# Patient Record
Sex: Female | Born: 1945 | Race: Black or African American | Hispanic: No | Smoking: Never smoker
Health system: Southern US, Community
[De-identification: ages and names within clinical notes are randomized; demographics above are authoritative.]

## PROBLEM LIST (undated history)

## (undated) DIAGNOSIS — E785 Hyperlipidemia, unspecified: Secondary | ICD-10-CM

## (undated) DIAGNOSIS — I10 Essential (primary) hypertension: Secondary | ICD-10-CM

## (undated) DIAGNOSIS — E119 Type 2 diabetes mellitus without complications: Secondary | ICD-10-CM

## (undated) DIAGNOSIS — E049 Nontoxic goiter, unspecified: Secondary | ICD-10-CM

## (undated) HISTORY — PX: PARTIAL HYSTERECTOMY: SHX80

## (undated) HISTORY — PX: ABDOMINAL HYSTERECTOMY: SHX81

## (undated) HISTORY — DX: Hyperlipidemia, unspecified: E78.5

## (undated) HISTORY — DX: Type 2 diabetes mellitus without complications: E11.9

## (undated) HISTORY — DX: Nontoxic goiter, unspecified: E04.9

---

## 1999-02-09 ENCOUNTER — Encounter: Admission: RE | Admit: 1999-02-09 | Discharge: 1999-02-09 | Payer: Self-pay | Admitting: Internal Medicine

## 1999-02-09 ENCOUNTER — Encounter: Payer: Self-pay | Admitting: Internal Medicine

## 2000-02-16 ENCOUNTER — Other Ambulatory Visit: Admission: RE | Admit: 2000-02-16 | Discharge: 2000-02-16 | Payer: Self-pay | Admitting: Obstetrics and Gynecology

## 2000-03-15 ENCOUNTER — Encounter: Admission: RE | Admit: 2000-03-15 | Discharge: 2000-03-15 | Payer: Self-pay | Admitting: Internal Medicine

## 2000-03-15 ENCOUNTER — Encounter: Payer: Self-pay | Admitting: Internal Medicine

## 2001-03-29 ENCOUNTER — Encounter: Payer: Self-pay | Admitting: Internal Medicine

## 2001-03-29 ENCOUNTER — Encounter: Admission: RE | Admit: 2001-03-29 | Discharge: 2001-03-29 | Payer: Self-pay | Admitting: Internal Medicine

## 2001-06-21 ENCOUNTER — Other Ambulatory Visit: Admission: RE | Admit: 2001-06-21 | Discharge: 2001-06-21 | Payer: Self-pay | Admitting: Obstetrics and Gynecology

## 2002-04-03 ENCOUNTER — Encounter: Admission: RE | Admit: 2002-04-03 | Discharge: 2002-04-03 | Payer: Self-pay | Admitting: Internal Medicine

## 2002-04-03 ENCOUNTER — Encounter: Payer: Self-pay | Admitting: Internal Medicine

## 2002-08-19 ENCOUNTER — Other Ambulatory Visit: Admission: RE | Admit: 2002-08-19 | Discharge: 2002-08-19 | Payer: Self-pay | Admitting: Obstetrics and Gynecology

## 2002-12-13 ENCOUNTER — Ambulatory Visit (HOSPITAL_COMMUNITY): Admission: RE | Admit: 2002-12-13 | Discharge: 2002-12-13 | Payer: Self-pay | Admitting: Internal Medicine

## 2003-05-06 ENCOUNTER — Encounter: Admission: RE | Admit: 2003-05-06 | Discharge: 2003-05-06 | Payer: Self-pay | Admitting: Internal Medicine

## 2003-09-10 ENCOUNTER — Other Ambulatory Visit: Admission: RE | Admit: 2003-09-10 | Discharge: 2003-09-10 | Payer: Self-pay | Admitting: Obstetrics and Gynecology

## 2004-05-26 ENCOUNTER — Ambulatory Visit (HOSPITAL_COMMUNITY): Admission: RE | Admit: 2004-05-26 | Discharge: 2004-05-26 | Payer: Self-pay | Admitting: Internal Medicine

## 2004-06-10 ENCOUNTER — Encounter: Admission: RE | Admit: 2004-06-10 | Discharge: 2004-06-10 | Payer: Self-pay | Admitting: Internal Medicine

## 2004-08-11 ENCOUNTER — Emergency Department (HOSPITAL_COMMUNITY): Admission: EM | Admit: 2004-08-11 | Discharge: 2004-08-11 | Payer: Self-pay | Admitting: Emergency Medicine

## 2004-12-09 ENCOUNTER — Other Ambulatory Visit: Admission: RE | Admit: 2004-12-09 | Discharge: 2004-12-09 | Payer: Self-pay | Admitting: Obstetrics and Gynecology

## 2005-05-31 ENCOUNTER — Ambulatory Visit (HOSPITAL_COMMUNITY): Admission: RE | Admit: 2005-05-31 | Discharge: 2005-05-31 | Payer: Self-pay | Admitting: Internal Medicine

## 2006-06-05 ENCOUNTER — Ambulatory Visit (HOSPITAL_COMMUNITY): Admission: RE | Admit: 2006-06-05 | Discharge: 2006-06-05 | Payer: Self-pay | Admitting: Internal Medicine

## 2007-06-07 ENCOUNTER — Ambulatory Visit (HOSPITAL_COMMUNITY): Admission: RE | Admit: 2007-06-07 | Discharge: 2007-06-07 | Payer: Self-pay | Admitting: Internal Medicine

## 2008-01-23 ENCOUNTER — Ambulatory Visit: Payer: Self-pay | Admitting: Internal Medicine

## 2008-02-06 ENCOUNTER — Ambulatory Visit: Payer: Self-pay | Admitting: Internal Medicine

## 2008-02-06 HISTORY — PX: COLONOSCOPY: SHX174

## 2008-02-12 ENCOUNTER — Ambulatory Visit (HOSPITAL_COMMUNITY): Admission: RE | Admit: 2008-02-12 | Discharge: 2008-02-12 | Payer: Self-pay | Admitting: Obstetrics and Gynecology

## 2008-02-15 ENCOUNTER — Ambulatory Visit: Admission: RE | Admit: 2008-02-15 | Discharge: 2008-02-15 | Payer: Self-pay | Admitting: Gynecology

## 2008-03-04 ENCOUNTER — Inpatient Hospital Stay (HOSPITAL_COMMUNITY): Admission: RE | Admit: 2008-03-04 | Discharge: 2008-03-06 | Payer: Self-pay | Admitting: Obstetrics & Gynecology

## 2008-03-04 ENCOUNTER — Encounter: Payer: Self-pay | Admitting: Gynecology

## 2008-04-08 ENCOUNTER — Ambulatory Visit: Admission: RE | Admit: 2008-04-08 | Discharge: 2008-04-08 | Payer: Self-pay | Admitting: Gynecologic Oncology

## 2008-06-11 ENCOUNTER — Ambulatory Visit (HOSPITAL_COMMUNITY): Admission: RE | Admit: 2008-06-11 | Discharge: 2008-06-11 | Payer: Self-pay | Admitting: Obstetrics and Gynecology

## 2009-06-17 ENCOUNTER — Ambulatory Visit (HOSPITAL_COMMUNITY): Admission: RE | Admit: 2009-06-17 | Discharge: 2009-06-17 | Payer: Self-pay | Admitting: Obstetrics and Gynecology

## 2010-05-09 ENCOUNTER — Encounter: Payer: Self-pay | Admitting: Obstetrics and Gynecology

## 2010-07-05 ENCOUNTER — Other Ambulatory Visit (HOSPITAL_COMMUNITY): Payer: Self-pay | Admitting: Obstetrics and Gynecology

## 2010-07-05 DIAGNOSIS — Z1231 Encounter for screening mammogram for malignant neoplasm of breast: Secondary | ICD-10-CM

## 2010-07-14 ENCOUNTER — Ambulatory Visit (HOSPITAL_COMMUNITY)
Admission: RE | Admit: 2010-07-14 | Discharge: 2010-07-14 | Disposition: A | Discharge status: Home / Self Care | Payer: BLUE CROSS/BLUE SHIELD | Source: Ambulatory Visit | Attending: Obstetrics and Gynecology | Admitting: Obstetrics and Gynecology

## 2010-07-14 DIAGNOSIS — Z1231 Encounter for screening mammogram for malignant neoplasm of breast: Secondary | ICD-10-CM | POA: Insufficient documentation

## 2010-08-31 NOTE — Op Note (Signed)
NAMEARELY, TINNER NO.:  [id number]   MEDICAL RECORD NO.:  [id number]          PATIENT TYPE:  INP   LOCATION:  1529                         FACILITY:  [facility]   PHYSICIAN:  De Blanch, M.D.DATE OF BIRTH:  1946-01-20   DATE OF PROCEDURE:  03/04/2008  DATE OF DISCHARGE:                               OPERATIVE REPORT   PREOPERATIVE DIAGNOSIS:  Complex pelvic mass.   POSTOPERATIVE DIAGNOSES:  Left ovarian dermoid, retroperitoneal  fibrosis.   PROCEDURE:  Bilateral salpingo-oophorectomy, bilateral ureterolysis.   SURGEON:  De Blanch, M.D.   FIRST ASSISTANT:  1. Antionette Char MD.  2. Telford Nab, R.N.   ANESTHESIA:  General orotracheal tube.   ESTIMATED BLOOD LOSS:  50 mL.   SURGICAL FINDINGS:  At the time of exploratory laparotomy, the left  ovary was smooth walled, measured approximately 5-6 cm in diameter.  It  was densely adherent to the left pelvic sidewall resulting in  retroperitoneal fibrosis, thus necessitating ureterolysis.  The right  ovary was normal in size, but again was densely adherent to the right  pelvic sidewall and sigmoid colon.  Exploration of the upper abdomen was  normal.   DESCRIPTION OF PROCEDURE:  The patient was brought to the operating room  and after satisfactory attainment of general anesthesia was placed in a  modified lithotomy position in [place] stirrups.  The anterior abdominal  wall, perineum and vagina were prepped with Betadine.  A Foley catheter  was inserted and the patient was draped.  The abdomen was entered  through a former Pfannenstiel incision.  Peritoneal washings were  obtained.  A Bookwalter retractor was assembled.  The upper abdomen was  explored with the above-noted findings.  The bowel was packed out of the  pelvis.  The left round ligament was grasped with a Kelly clamp and  divided and the retroperitoneal space opened identifying the vessels and  ureter.  The ovarian  vessels were skeletonized, clamped, cut, free tied  and suture ligated.  The ovary was densely adherent to the pelvic  sidewall and in order to recent remove it intact, the pelvic sidewall  peritoneum had to be incised circumferentially around the adherent  ovary.  In order to accomplish this safely, ureterolysis was performed  mobilizing the ureter away from the peritoneum and retracting it  laterally.  Using electrocautery for hemostasis, the tube and ovary were  then freed from their attachments to the pelvic sidewall.  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   Attention was turned to the right side of the pelvis.  The right round  ligament was divided and the retroperitoneal space opened.  The ovary  was densely adherent to the sigmoid mesentery.  This was incised and  using careful dissection underneath the ovary and dissecting into  mesentery of the sigmoid colon, the ovary was mobilized from deep in the  pelvis.  Laterally, we identified the ovarian vessels and ureter.  The  ovarian vessels were skeletonized, clamped, cut, free tied and suture  ligated.  Ureterolysis was then performed.  We mobilized  the ureter  laterally away from the retroperitoneal fibrosis.  The ovary was  ultimately resected with cautery for hemostasis.   The pelvis was inspected and hemostasis achieved with cautery.   The packs and retractors were removed.  The anterior abdominal wall was  closed in layers, the first being a running suture of 2-0 Vicryl on the  peritoneum.  The subfascial area was inspected and found to be  hemostatic.  The fascia was closed with a running suture of #1 PDS.  The  subcutaneous tissue was irrigated.  Hemostasis achieved with cautery.  The skin incision closed with staples.  A dressing was applied.  The  patient was awakened from anesthesia and taken to the recovery room in  satisfactory condition.  Sponge, needle and sponge  counts correct x2.      De Blanch, M.D.  Electronically Signed     DC/MEDQ  D:  03/04/2008  T:  03/05/2008  Job:  [id number]   cc:   Guy Sandifer. Henderson Cloud, M.D.  Fax: [phone]   Roseanna Rainbow, M.D.  Fax: [phone]   Telford Nab, R.N.  [address]  [place], [place] [address]

## 2010-08-31 NOTE — Consult Note (Signed)
NAME:  Donna Valenzuela, Donna Valenzuela NO.:  [id number]   MEDICAL RECORD NO.:  [id number]          PATIENT TYPE:  OUT   LOCATION:  GYN                          FACILITY:  [facility]   PHYSICIAN:  Paola A. Duard Brady, MD    DATE OF BIRTH:  04-May-1945   DATE OF CONSULTATION:  04/08/2008  DATE OF DISCHARGE:                                 CONSULTATION   The patient is a 65 year old who is referred to [place] for a newly diagnosed  right adnexal mass.  Her CA-125 was normal and the mass measured 6.4 cm  and it was cystic and solid.  On March 04, 2008 she underwent  exploratory laparotomy, lysis of adhesions, bilateral ureteral lysis and  BSO.  Operative findings included 5-6 cm left ovarian mass that was  densely adherent to left pelvic sidewall resulting in retroperitoneal  fibrosis necessitating ureteral lysis.  The right ovary was normal in  size but was densely adherent to the right pelvic sidewall and sigmoid  colon.  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  There was no malignancy identified.  The  right ovary was unremarkable.  Her washings were negative.  The patient  has done well postoperatively.  She does complain of a cough that she  does not have in the morning, but during the day she develops it.  It is  a dry cough, it is nonproductive.  She does complain of an odor to her  incision.  There is no discharge.  She states she always had a bit of a  pannus and  she would usually put a towel or gauze in that area and she  has not noticed the odor  before.  She is noticing it now since surgery.  There is no discharge on the gauze.  She does have some hot flashes as  well as night sweats.  About 2 years ago she was on Premarin.  She read  a lot of information regarding hormone replacement therapy and the  risks, and she soon came off it.  She states while she is having hot  flashes and night sweats, at this point she does not wish to  be a on any  hormones.   PHYSICAL EXAMINATION:  Weight 165 kg which is down 4 kg from her preop  consultation.  ABDOMEN:  Shows a well-healed transverse skin incision.  Abdomen is  soft, nontender, nondistended.  There are no palpable masses or  hepatosplenomegaly.  There is no discharge.  The wound is well-healed.  LUNGS:  Clear to auscultation bilaterally anterior and posterior lung  fields.   ASSESSMENT:  [age]year-old status post laparotomy, BSO for benign  disease.   PLAN:  1. Her lungs sound clear.  If she continues to have a persistent      cough, I have encouraged her to see her primary      care physician.  She did have a preoperative chest x-ray that was      completely negative.  2. She will follow up with Dr. Huntley Dec on a p.r.n. basis  for her      routine gynecology care.  She knows that we will be happy to see      her in the future should the need arise.      Paola A. Duard Brady, MD  Electronically Signed     PAG/MEDQ  D:  04/08/2008  T:  04/08/2008  Job:  [id number]   cc:   Telford Nab, R.N.  [address]  [place], [place] [address]   Guy Sandifer. Henderson Cloud, M.D.  Fax: [phone]

## 2010-08-31 NOTE — Consult Note (Signed)
NAME:  Donna Valenzuela, Donna Valenzuela NO.:  [id number]   MEDICAL RECORD NO.:  [id number]          PATIENT TYPE:  OUT   LOCATION:  GYN                          FACILITY:  [facility]   PHYSICIAN:  De Blanch, M.D.DATE OF BIRTH:  [birth year]   DATE OF CONSULTATION:  DATE OF DISCHARGE:                                 CONSULTATION   CHIEF COMPLAINT:  Pelvic mass.   HISTORY OF PRESENT ILLNESS:  A 65 year old African-American female seen  in consultation at the request of Dr. Henderson Cloud regarding management of a  newly-diagnosed adnexal mass.  Apparently the patient had the mass  detected at the time of routine annual pelvic examination.  This had  been further evaluated with a CT scan and ultrasound.  CT scan shows the  mass is 6.4 x 3.3 x 4.7 cm and on ultrasound it is solid and cystic.  CA-  125 value is 8.6 units per mL.  The patient is essentially asymptomatic.   The patient does have a past history of undergoing a TAH and possibly  right salpingo-oophorectomy in her 30's for symptomatic uterine  fibroids.   PAST MEDICAL HISTORY:  Medical illnesses:  1. Hypertension.  2. Hypothyroidism.   ALLERGIES:  SULFA (causes respiratory symptoms).   CURRENT MEDICATIONS:  Anti-hypertensive and Synthroid, and diuretic.  Patient did not bring her medicines with her today, so we do not know  the specific names.   FAMILY HISTORY:  Negative for gynecologic, breast, or colon cancer.   SOCIAL HISTORY:  The patient is divorced.  She does not smoke.  She does  not work outside the home.   REVIEW OF SYSTEMS:  Ten-point comprehensive review of systems is  negative, except as noted above.   PHYSICAL EXAMINATION:  VITAL SIGNS:  Height 5 feet 3.  Weight 169  pounds.  Blood pressure 159/106.  Pulse.  GENERAL:  Patient is a healthy black female in no acute distress.  HEENT:  Negative.  NECK:  Supple without thyromegaly.  There is no supraclavicular or  cervical adenopathy.  ABDOMEN:  Soft,  nontender, no masses, organomegaly, or hernias noted.  PELVIC EXAMINATION:  EG, BUS, vagina, and urethra are normal.  Cervix  and uterus surgically absent.  On bimanual examination, on the posterior  vaginal cuff and to the left is a mass measuring approximately 5 to 6 cm  in diameter.  It is not tender, and there is no nodularity.   IMPRESSION:  Complex pelvic mass with a normal CA-125 in menopausal  patient.  I had a lengthy discussion with the patient regarding the  differential diagnosis.  I believe this is likely benign, but I would  recommend it be resected and intraoperative frozen section obtained to  guide any further surgical management.  The risks of surgery, including  hemorrhage, infection, injury to adjacent viscera, thromboembolic  complications, and anesthetic risks were outlined.   Regarding the surgical incision, the patient does have a prior  Pfannenstiel incision.  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  However, the  patient is informed that if she  does have ovarian cancer, we would  likely end up creating a second midline incision to provide for adequate  debulking and staging.   The patient wishes to go ahead with surgery, and we will coordinate this  with Dr. Henderson Cloud.      De Blanch, M.D.  Electronically Signed     DC/MEDQ  D:  02/15/2008  T:  02/15/2008  Job:  [id number]   cc:   Guy Sandifer. Henderson Cloud, M.D.  Fax: [phone]   Telford Nab, R.N.  [address]  [place], [place] [address]

## 2011-01-18 LAB — CBC
HCT: 39.8
HCT: 42.5
MCHC: 33.8
MCV: 89
Platelets: 204
Platelets: 207

## 2011-01-18 LAB — BASIC METABOLIC PANEL
BUN: 10
CO2: 27
Chloride: 104
Creatinine, Ser: 0.98
Glucose, Bld: 128 — ABNORMAL HIGH

## 2011-01-18 LAB — DIFFERENTIAL
Basophils Relative: 1
Lymphocytes Relative: 28
Monocytes Absolute: 0.3
Neutro Abs: 2.7

## 2011-01-18 LAB — COMPREHENSIVE METABOLIC PANEL
ALT: 25
AST: 25
Alkaline Phosphatase: 93
Calcium: 9
Chloride: 105
Creatinine, Ser: 0.82
GFR calc Af Amer: >60
Sodium: 143
Total Bilirubin: 1.1
Total Protein: 7.2

## 2011-01-18 LAB — TYPE AND SCREEN: ABO/RH(D): O POS

## 2011-01-18 LAB — ABO/RH: ABO/RH(D): O POS

## 2011-01-18 LAB — POCT I-STAT 4, (NA,K, GLUC, HGB,HCT): Sodium: 141

## 2011-06-09 ENCOUNTER — Other Ambulatory Visit (HOSPITAL_COMMUNITY): Payer: Self-pay | Admitting: Internal Medicine

## 2011-06-09 DIAGNOSIS — Z1231 Encounter for screening mammogram for malignant neoplasm of breast: Secondary | ICD-10-CM

## 2011-07-15 ENCOUNTER — Ambulatory Visit (HOSPITAL_COMMUNITY)
Admission: RE | Admit: 2011-07-15 | Discharge: 2011-07-15 | Disposition: A | Discharge status: Home / Self Care | Payer: Medicare Other | Source: Ambulatory Visit | Attending: Internal Medicine | Admitting: Internal Medicine

## 2011-07-15 DIAGNOSIS — Z1231 Encounter for screening mammogram for malignant neoplasm of breast: Secondary | ICD-10-CM | POA: Insufficient documentation

## 2012-06-12 ENCOUNTER — Other Ambulatory Visit (HOSPITAL_COMMUNITY): Payer: Self-pay | Admitting: Internal Medicine

## 2012-06-12 DIAGNOSIS — Z1231 Encounter for screening mammogram for malignant neoplasm of breast: Secondary | ICD-10-CM

## 2012-07-15 ENCOUNTER — Encounter (HOSPITAL_COMMUNITY): Payer: Self-pay | Admitting: Emergency Medicine

## 2012-07-15 ENCOUNTER — Emergency Department (HOSPITAL_COMMUNITY)
Admission: EM | Admit: 2012-07-15 | Discharge: 2012-07-16 | Disposition: A | Discharge status: Home / Self Care | Payer: 59 | Attending: Emergency Medicine | Admitting: Emergency Medicine

## 2012-07-15 DIAGNOSIS — Z79899 Other long term (current) drug therapy: Secondary | ICD-10-CM | POA: Insufficient documentation

## 2012-07-15 DIAGNOSIS — R112 Nausea with vomiting, unspecified: Secondary | ICD-10-CM | POA: Insufficient documentation

## 2012-07-15 DIAGNOSIS — E876 Hypokalemia: Secondary | ICD-10-CM | POA: Insufficient documentation

## 2012-07-15 DIAGNOSIS — R197 Diarrhea, unspecified: Secondary | ICD-10-CM | POA: Insufficient documentation

## 2012-07-15 DIAGNOSIS — I10 Essential (primary) hypertension: Secondary | ICD-10-CM | POA: Insufficient documentation

## 2012-07-15 HISTORY — DX: Essential (primary) hypertension: I10

## 2012-07-15 LAB — COMPREHENSIVE METABOLIC PANEL
Alkaline Phosphatase: 101 U/L (ref 39–117)
BUN: 19 mg/dL (ref 6–23)
GFR calc Af Amer: 77 mL/min — ABNORMAL LOW (ref >90)
Glucose, Bld: 122 mg/dL — ABNORMAL HIGH (ref 70–99)
Potassium: 2.7 mEq/L — CL (ref 3.5–5.1)
Total Bilirubin: 0.7 mg/dL (ref 0.3–1.2)
Total Protein: 7.6 g/dL (ref 6.0–8.3)

## 2012-07-15 LAB — CBC WITH DIFFERENTIAL/PLATELET
Eosinophils Absolute: 0 10*3/uL (ref 0.0–0.7)
HCT: 41.7 % (ref 36.0–46.0)
Hemoglobin: 14 g/dL (ref 12.0–15.0)
Lymphs Abs: 0.8 10*3/uL (ref 0.7–4.0)
MCH: 29.4 pg (ref 26.0–34.0)
MCV: 87.4 fL (ref 78.0–100.0)
Monocytes Relative: 16 % — ABNORMAL HIGH (ref 3–12)
Neutrophils Relative %: 69 % (ref 43–77)
RBC: 4.77 MIL/uL (ref 3.87–5.11)

## 2012-07-15 MED ORDER — ONDANSETRON HCL 4 MG/2ML IJ SOLN
4.0000 mg | Freq: Once | INTRAMUSCULAR | Status: AC
  Administered 2012-07-15: 4 mg via INTRAVENOUS
  Filled 2012-07-15: qty 2

## 2012-07-15 MED ORDER — SODIUM CHLORIDE 0.9 % IV BOLUS (SEPSIS)
1000.0000 mL | Freq: Once | INTRAVENOUS | Status: AC
  Administered 2012-07-15: 1000 mL via INTRAVENOUS

## 2012-07-15 NOTE — ED Notes (Signed)
Per pt, N/V/D since Friday night.

## 2012-07-15 NOTE — ED Notes (Signed)
Pt c/o n/v/d x 2 days. PWD, denies pain

## 2012-07-16 ENCOUNTER — Ambulatory Visit (HOSPITAL_COMMUNITY): Payer: Medicare Other

## 2012-07-16 LAB — URINALYSIS, ROUTINE W REFLEX MICROSCOPIC
Ketones, ur: NEGATIVE mg/dL
Leukocytes, UA: NEGATIVE
Nitrite: NEGATIVE
Protein, ur: 30 mg/dL — AB
Urobilinogen, UA: 1 mg/dL (ref 0.0–1.0)

## 2012-07-16 MED ORDER — POTASSIUM CHLORIDE 10 MEQ/100ML IV SOLN
10.0000 meq | INTRAVENOUS | Status: DC
  Administered 2012-07-16 (×2): 10 meq via INTRAVENOUS
  Filled 2012-07-16: qty 300

## 2012-07-16 MED ORDER — POTASSIUM CHLORIDE ER 10 MEQ PO TBCR: 10.0000 meq | EXTENDED_RELEASE_TABLET | Freq: Two times a day (BID) | ORAL | Status: DC

## 2012-07-16 MED ORDER — ONDANSETRON HCL 4 MG PO TABS: 4.0000 mg | ORAL_TABLET | Freq: Four times a day (QID) | ORAL | Status: DC

## 2012-07-16 NOTE — ED Provider Notes (Signed)
History     CSN: [id number]  Arrival date & time 07/15/12  2208   First MD Initiated Contact with Patient 07/15/12 2302      Chief Complaint  Patient presents with  . Emesis  . Diarrhea    (Consider location/radiation/quality/duration/timing/severity/associated sxs/prior treatment) HPI 67 yo female presents to the ER with complaint of n/v/d x 3 days.  No abdominal pain.  No fevers.  Family members with similar sxs.  No blood in emesis or diarrhea.  No urinary symptoms.    Past Medical History  Diagnosis Date  . Hypertension     Past Surgical History  Procedure Laterality Date  . Abdominal hysterectomy      No family history on file.  History  Substance Use Topics  . Smoking status: Never Smoker   . Smokeless tobacco: Not on file  . Alcohol Use: No    OB History   Grav Para Term Preterm Abortions TAB SAB Ect Mult Living                  Review of Systems  All other systems reviewed and are negative.    Allergies  Sulfonamide derivatives  Home Medications   Current Outpatient Rx  Name  Route  Sig  Dispense  Refill  . amLODipine (NORVASC) 5 MG tablet   Oral   Take 5 mg by mouth daily.         . carvedilol (COREG) 25 MG tablet   Oral   Take 25 mg by mouth 2 (two) times daily with a meal.         . hydrochlorothiazide (HYDRODIURIL) 25 MG tablet   Oral   Take 25 mg by mouth daily.         Marland Kitchen levothyroxine (SYNTHROID, LEVOTHROID) 100 MCG tablet   Oral   Take 100 mcg by mouth daily.         . ondansetron (ZOFRAN) 4 MG tablet   Oral   Take 1 tablet (4 mg total) by mouth every 6 (six) hours.   12 tablet   0   . potassium chloride (K-DUR) 10 MEQ tablet   Oral   Take 1 tablet (10 mEq total) by mouth 2 (two) times daily.   30 tablet   0     BP 124/74  Pulse 101  Temp(Src) 99.8 F (37.7 C) (Oral)  Resp 18  Wt 174 lb (78.926 kg)  SpO2 91%  Physical Exam  Nursing note and vitals reviewed. Constitutional: She is oriented to person,  place, and time. She appears well-developed and well-nourished.  HENT:  Head: Normocephalic and atraumatic.  Right Ear: External ear normal.  Left Ear: External ear normal.  Nose: Nose normal.  Dry mucous membranes  Eyes: Conjunctivae and EOM are normal. Pupils are equal, round, and reactive to light.  Neck: Normal range of motion. Neck supple. No JVD present. No tracheal deviation present. No thyromegaly present.  Cardiovascular: Normal rate, regular rhythm, normal heart sounds and intact distal pulses.  Exam reveals no gallop and no friction rub.   No murmur heard. Pulmonary/Chest: Effort normal and breath sounds normal. No stridor. No respiratory distress. She has no wheezes. She has no rales. She exhibits no tenderness.  Abdominal: Soft. She exhibits no distension and no mass. There is no tenderness. There is no rebound and no guarding.  hyperactive  Musculoskeletal: Normal range of motion. She exhibits no edema and no tenderness.  Lymphadenopathy:    She has no cervical adenopathy.  Neurological: She is alert and oriented to person, place, and time. She exhibits normal muscle tone. Coordination normal.  Skin: Skin is warm and dry. No rash noted. No erythema. No pallor.  Psychiatric: She has a normal mood and affect. Her behavior is normal. Judgment and thought content normal.    ED Course  Procedures (including critical care time)  Labs Reviewed  CBC WITH DIFFERENTIAL - Abnormal; Notable for the following:    Monocytes Relative 16 (*)    All other components within normal limits  COMPREHENSIVE METABOLIC PANEL - Abnormal; Notable for the following:    Potassium 2.7 (*)    Glucose, Bld 122 (*)    GFR calc non Af Amer 66 (*)    GFR calc Af Amer 77 (*)    All other components within normal limits  URINALYSIS, ROUTINE W REFLEX MICROSCOPIC - Abnormal; Notable for the following:    Specific Gravity, Urine 1.031 (*)    Hgb urine dipstick TRACE (*)    Protein, ur 30 (*)    All  other components within normal limits  URINE MICROSCOPIC-ADD ON - Abnormal; Notable for the following:    Squamous Epithelial / LPF FEW (*)    Bacteria, UA FEW (*)    Casts HYALINE CASTS (*)    All other components within normal limits   No results found.   1. Nausea vomiting and diarrhea   2. Hypokalemia       MDM  67 year old female with 3 days of nausea, vomiting, diarrhea.  No abdominal pain.  Patient found to have hypokalemia.  Otherwise, exam unremarkable.  She has tolerated by mouth.  She's received some IV potassium here.  We'll discharge her home with short course of oral potassium.  Suspect hypokalemia do to recent losses from diarrhea.        Olivia Mackie, MD 07/18/12 1323

## 2012-07-24 ENCOUNTER — Ambulatory Visit (HOSPITAL_COMMUNITY)
Admission: RE | Admit: 2012-07-24 | Discharge: 2012-07-24 | Disposition: A | Discharge status: Home / Self Care | Payer: Medicare Other | Source: Ambulatory Visit | Attending: Internal Medicine | Admitting: Internal Medicine

## 2012-07-24 DIAGNOSIS — Z1231 Encounter for screening mammogram for malignant neoplasm of breast: Secondary | ICD-10-CM | POA: Insufficient documentation

## 2013-06-25 ENCOUNTER — Other Ambulatory Visit (HOSPITAL_COMMUNITY): Payer: Self-pay | Admitting: Internal Medicine

## 2013-06-25 DIAGNOSIS — Z1231 Encounter for screening mammogram for malignant neoplasm of breast: Secondary | ICD-10-CM

## 2013-07-29 ENCOUNTER — Ambulatory Visit (HOSPITAL_COMMUNITY)
Admission: RE | Admit: 2013-07-29 | Discharge: 2013-07-29 | Disposition: A | Discharge status: Home / Self Care | Payer: Medicare Other | Source: Ambulatory Visit | Attending: Internal Medicine | Admitting: Internal Medicine

## 2013-07-29 DIAGNOSIS — Z1231 Encounter for screening mammogram for malignant neoplasm of breast: Secondary | ICD-10-CM | POA: Insufficient documentation

## 2014-01-16 ENCOUNTER — Encounter: Payer: Self-pay | Admitting: Internal Medicine

## 2014-07-07 ENCOUNTER — Other Ambulatory Visit (HOSPITAL_COMMUNITY): Payer: Self-pay | Admitting: Internal Medicine

## 2014-07-07 DIAGNOSIS — Z1231 Encounter for screening mammogram for malignant neoplasm of breast: Secondary | ICD-10-CM

## 2014-07-31 ENCOUNTER — Ambulatory Visit (HOSPITAL_COMMUNITY)
Admission: RE | Admit: 2014-07-31 | Discharge: 2014-07-31 | Disposition: A | Discharge status: Home / Self Care | Payer: Medicare Other | Source: Ambulatory Visit | Attending: Internal Medicine | Admitting: Internal Medicine

## 2014-07-31 DIAGNOSIS — Z1231 Encounter for screening mammogram for malignant neoplasm of breast: Secondary | ICD-10-CM | POA: Diagnosis not present

## 2015-06-29 ENCOUNTER — Other Ambulatory Visit: Payer: Self-pay

## 2015-06-29 DIAGNOSIS — Z1231 Encounter for screening mammogram for malignant neoplasm of breast: Secondary | ICD-10-CM

## 2015-08-03 ENCOUNTER — Ambulatory Visit
Admission: RE | Admit: 2015-08-03 | Discharge: 2015-08-03 | Disposition: A | Discharge status: Home / Self Care | Payer: Medicare Other | Source: Ambulatory Visit

## 2015-08-03 DIAGNOSIS — Z1231 Encounter for screening mammogram for malignant neoplasm of breast: Secondary | ICD-10-CM

## 2016-06-29 ENCOUNTER — Other Ambulatory Visit: Payer: Self-pay | Admitting: Internal Medicine

## 2016-06-29 DIAGNOSIS — Z1231 Encounter for screening mammogram for malignant neoplasm of breast: Secondary | ICD-10-CM

## 2016-08-03 ENCOUNTER — Ambulatory Visit
Admission: RE | Admit: 2016-08-03 | Discharge: 2016-08-03 | Disposition: A | Discharge status: Home / Self Care | Payer: Medicare Other | Source: Ambulatory Visit | Attending: Internal Medicine | Admitting: Internal Medicine

## 2016-08-03 DIAGNOSIS — Z1231 Encounter for screening mammogram for malignant neoplasm of breast: Secondary | ICD-10-CM

## 2016-12-23 ENCOUNTER — Other Ambulatory Visit (HOSPITAL_COMMUNITY)
Admission: RE | Admit: 2016-12-23 | Discharge: 2016-12-23 | Disposition: A | Discharge status: Home / Self Care | Payer: Medicare Other | Source: Ambulatory Visit | Attending: Internal Medicine | Admitting: Internal Medicine

## 2016-12-23 DIAGNOSIS — M25469 Effusion, unspecified knee: Secondary | ICD-10-CM | POA: Diagnosis present

## 2016-12-23 LAB — SYNOVIAL CELL COUNT + DIFF, W/ CRYSTALS
Crystals, Fluid: NONE SEEN
Eosinophils-Synovial: 0 % (ref 0–1)
LYMPHOCYTES-SYNOVIAL FLD: 20 % (ref 0–20)
MONOCYTE-MACROPHAGE-SYNOVIAL FLUID: 70 % (ref 50–90)
Neutrophil, Synovial: 10 % (ref 0–25)
WBC, Synovial: 175 /mm3 (ref 0–200)

## 2016-12-26 LAB — BODY FLUID CULTURE: Culture: NO GROWTH

## 2017-06-23 ENCOUNTER — Other Ambulatory Visit: Payer: Self-pay | Admitting: Internal Medicine

## 2017-06-23 DIAGNOSIS — Z139 Encounter for screening, unspecified: Secondary | ICD-10-CM

## 2017-08-04 ENCOUNTER — Ambulatory Visit: Payer: Medicare Other

## 2017-08-16 ENCOUNTER — Ambulatory Visit
Admission: RE | Admit: 2017-08-16 | Discharge: 2017-08-16 | Disposition: A | Discharge status: Home / Self Care | Payer: Medicare Other | Source: Ambulatory Visit | Attending: Internal Medicine | Admitting: Internal Medicine

## 2017-08-16 DIAGNOSIS — Z139 Encounter for screening, unspecified: Secondary | ICD-10-CM

## 2018-02-15 ENCOUNTER — Encounter: Payer: Self-pay | Admitting: Internal Medicine

## 2018-02-22 ENCOUNTER — Encounter: Payer: Self-pay | Admitting: Internal Medicine

## 2018-04-02 ENCOUNTER — Ambulatory Visit (AMBULATORY_SURGERY_CENTER): Payer: Self-pay | Admitting: *Deleted

## 2018-04-02 ENCOUNTER — Encounter: Payer: Self-pay | Admitting: Internal Medicine

## 2018-04-02 ENCOUNTER — Encounter: Payer: Self-pay | Admitting: *Deleted

## 2018-04-02 VITALS — Ht 62.0 in | Wt 173.0 lb

## 2018-04-02 DIAGNOSIS — Z1211 Encounter for screening for malignant neoplasm of colon: Secondary | ICD-10-CM

## 2018-04-02 MED ORDER — NA SULFATE-K SULFATE-MG SULF 17.5-3.13-1.6 GM/177ML PO SOLN
ORAL | 0 refills | Status: DC
Start: 2018-04-02 — End: 2018-04-16

## 2018-04-02 NOTE — Progress Notes (Signed)
Patient denies any allergies to eggs or soy. Patient denies any problems with anesthesia/sedation. Patient denies any oxygen use at home. Patient denies taking any diet/weight loss medications or blood thinners. EMMI education offered, pt declined.  

## 2018-04-16 ENCOUNTER — Encounter: Payer: Self-pay | Admitting: Internal Medicine

## 2018-04-16 ENCOUNTER — Ambulatory Visit (AMBULATORY_SURGERY_CENTER): Payer: Medicare Other | Admitting: Internal Medicine

## 2018-04-16 VITALS — BP 130/70 | HR 72 | Temp 97.5°F | Resp 16 | Ht 62.0 in | Wt 173.0 lb

## 2018-04-16 DIAGNOSIS — Z1211 Encounter for screening for malignant neoplasm of colon: Secondary | ICD-10-CM

## 2018-04-16 MED ORDER — SODIUM CHLORIDE 0.9 % IV SOLN: 500.0000 mL | Freq: Once | INTRAVENOUS | Status: DC

## 2018-04-16 NOTE — Progress Notes (Signed)
Report to PACU, RN, vss, BBS= Clear.  

## 2018-04-16 NOTE — Op Note (Signed)
[facility] Patient Name: Donna Valenzuela Procedure Date: 04/16/2018 11:41 AM MRN: [id number] Endoscopist: Wilhemina Bonito. Marina Goodell , MD Age: 72 Referring MD:  Date of Birth: 1945-06-15 Gender: Female Account #: [id number] Procedure:                Colonoscopy Indications:              Screening for colorectal malignant neoplasm.                            Negative exam 2009 Medicines:                Monitored Anesthesia Care Procedure:                Pre-Anesthesia Assessment:                           - Prior to the procedure, a History and Physical                            was performed, and patient medications and                            allergies were reviewed. The patient's tolerance of                            previous anesthesia was also reviewed. The risks                            and benefits of the procedure and the sedation                            options and risks were discussed with the patient.                            All questions were answered, and informed consent                            was obtained. Prior Anticoagulants: The patient has                            taken no previous anticoagulant or antiplatelet                            agents. ASA Grade Assessment: II - A patient with                            mild systemic disease. After reviewing the risks                            and benefits, the patient was deemed in                            satisfactory condition to undergo the procedure.  After obtaining informed consent, the colonoscope                            was passed under direct vision. Throughout the                            procedure, the patient's blood pressure, pulse, and                            oxygen saturations were monitored continuously. The                            Colonoscope was introduced through the anus and                            advanced to the the cecum, identified by                          appendiceal orifice and ileocecal valve. The                            ileocecal valve, appendiceal orifice, and rectum                            were photographed. The quality of the bowel                            preparation was excellent. The colonoscopy was                            performed without difficulty. The patient tolerated                            the procedure well. The bowel preparation used was                            SUPREP. Scope In: 11:52:15 AM Scope Out: 12:03:16 PM Scope Withdrawal Time: 0 hours 7 minutes 10 seconds  Total Procedure Duration: 0 hours 11 minutes 1 second  Findings:                 The entire examined colon appeared normal on direct                            and retroflexion views. Complications:            No immediate complications. Estimated blood loss:                            None. Estimated Blood Loss:     Estimated blood loss: none. Impression:               - The entire examined colon is normal on direct and                            retroflexion views.                           -  No specimens collected. Recommendation:           - Repeat colonoscopy is not recommended for                            screening purposes.                           - Patient has a contact number available for                            emergencies. The signs and symptoms of potential                            delayed complications were discussed with the                            patient. Return to normal activities tomorrow.                            Written discharge instructions were provided to the                            patient.                           - Resume previous diet.                           - Continue present medications. Wilhemina BonitoJohn N. Marina GoodellPerry, MD 04/16/2018 12:06:50 PM This report has been signed electronically.

## 2018-04-16 NOTE — Patient Instructions (Signed)
YOU HAD AN ENDOSCOPIC PROCEDURE TODAY AT THE [facility]:   Refer to the procedure report that was given to you for any specific questions about what was found during the examination.  If the procedure report does not answer your questions, please call your gastroenterologist to clarify.  If you requested that your care partner not be given the details of your procedure findings, then the procedure report has been included in a sealed envelope for you to review at your convenience later.  YOU SHOULD EXPECT: Some feelings of bloating in the abdomen. Passage of more gas than usual.  Walking can help get rid of the air that was put into your GI tract during the procedure and reduce the bloating. If you had a lower endoscopy (such as a colonoscopy or flexible sigmoidoscopy) you may notice spotting of blood in your stool or on the toilet paper. If you underwent a bowel prep for your procedure, you may not have a normal bowel movement for a few days.  Please Note:  You might notice some irritation and congestion in your nose or some drainage.  This is from the oxygen used during your procedure.  There is no need for concern and it should clear up in a day or so.  SYMPTOMS TO REPORT IMMEDIATELY:   Following lower endoscopy (colonoscopy or flexible sigmoidoscopy):  Excessive amounts of blood in the stool  Significant tenderness or worsening of abdominal pains  Swelling of the abdomen that is new, acute  Fever of 100F or higher  For urgent or emergent issues, a gastroenterologist can be reached at any hour by calling [phone].   DIET:  We do recommend a small meal at first, but then you may proceed to your regular diet.  Drink plenty of fluids but you should avoid alcoholic beverages for 24 hours.  ACTIVITY:  You should plan to take it easy for the rest of today and you should NOT DRIVE or use heavy machinery until tomorrow (because of the sedation medicines used during the test).     FOLLOW UP: Our staff will call the number listed on your records the next business day following your procedure to check on you and address any questions or concerns that you may have regarding the information given to you following your procedure. If we do not reach you, we will leave a message.  However, if you are feeling well and you are not experiencing any problems, there is no need to return our call.  We will assume that you have returned to your regular daily activities without incident.  If any biopsies were taken you will be contacted by phone or by letter within the next 1-3 weeks.  Please call us at [phone] if you have not heard about the biopsies in 3 weeks.    SIGNATURES/CONFIDENTIALITY: You and/or your care partner have signed paperwork which will be entered into your electronic medical record.  These signatures attest to the fact that that the information above on your After Visit Summary has been reviewed and is understood.  Full responsibility of the confidentiality of this discharge information lies with you and/or your care-partner. 

## 2018-04-16 NOTE — Progress Notes (Signed)
Pt's states no medical or surgical changes since previsit or office visit. 

## 2018-04-17 ENCOUNTER — Telehealth: Payer: Self-pay

## 2018-04-17 NOTE — Telephone Encounter (Signed)
  Follow up Call-  Call back number 04/16/2018  Post procedure Call Back phone  # [phone]  Permission to leave phone message Yes  Some recent data might be hidden     Patient questions:  Do you have a fever, pain , or abdominal swelling? No Pain Score  0  Have you tolerated food without any problems? Yes  Have you been able to return to your normal activities? Yes  Do you have any questions about your discharge instructions: Diet   No Medications  No Follow up visit  No  Do you have questions or concerns about your Care? No  Actions: * If pain score is 4 or above: No action needed, pain <4

## 2018-07-06 ENCOUNTER — Other Ambulatory Visit: Payer: Self-pay | Admitting: Internal Medicine

## 2018-07-06 DIAGNOSIS — Z1231 Encounter for screening mammogram for malignant neoplasm of breast: Secondary | ICD-10-CM

## 2018-08-21 ENCOUNTER — Ambulatory Visit: Payer: Medicare Other

## 2018-10-02 ENCOUNTER — Other Ambulatory Visit: Payer: Self-pay

## 2018-10-02 ENCOUNTER — Ambulatory Visit
Admission: RE | Admit: 2018-10-02 | Discharge: 2018-10-02 | Disposition: A | Discharge status: Home / Self Care | Payer: Medicare Other | Source: Ambulatory Visit | Attending: Internal Medicine | Admitting: Internal Medicine

## 2018-10-02 DIAGNOSIS — Z1231 Encounter for screening mammogram for malignant neoplasm of breast: Secondary | ICD-10-CM

## 2019-08-02 DIAGNOSIS — I10 Essential (primary) hypertension: Secondary | ICD-10-CM | POA: Diagnosis not present

## 2019-08-02 DIAGNOSIS — E669 Obesity, unspecified: Secondary | ICD-10-CM | POA: Diagnosis not present

## 2019-08-02 DIAGNOSIS — E038 Other specified hypothyroidism: Secondary | ICD-10-CM | POA: Diagnosis not present

## 2019-08-02 DIAGNOSIS — R609 Edema, unspecified: Secondary | ICD-10-CM | POA: Diagnosis not present

## 2019-08-02 DIAGNOSIS — E1169 Type 2 diabetes mellitus with other specified complication: Secondary | ICD-10-CM | POA: Diagnosis not present

## 2019-08-26 ENCOUNTER — Other Ambulatory Visit: Payer: Self-pay | Admitting: Orthopedic Surgery

## 2019-08-26 ENCOUNTER — Other Ambulatory Visit: Payer: Self-pay | Admitting: Internal Medicine

## 2019-08-26 DIAGNOSIS — Z1231 Encounter for screening mammogram for malignant neoplasm of breast: Secondary | ICD-10-CM

## 2019-10-03 ENCOUNTER — Ambulatory Visit
Admission: RE | Admit: 2019-10-03 | Discharge: 2019-10-03 | Disposition: A | Discharge status: Home / Self Care | Payer: Medicare Other | Source: Ambulatory Visit | Attending: Internal Medicine | Admitting: Internal Medicine

## 2019-10-03 ENCOUNTER — Other Ambulatory Visit: Payer: Self-pay

## 2019-10-03 DIAGNOSIS — Z1231 Encounter for screening mammogram for malignant neoplasm of breast: Secondary | ICD-10-CM

## 2020-02-28 DIAGNOSIS — I10 Essential (primary) hypertension: Secondary | ICD-10-CM | POA: Diagnosis not present

## 2020-02-28 DIAGNOSIS — R002 Palpitations: Secondary | ICD-10-CM | POA: Diagnosis not present

## 2020-02-28 DIAGNOSIS — E039 Hypothyroidism, unspecified: Secondary | ICD-10-CM | POA: Diagnosis not present

## 2020-02-28 DIAGNOSIS — E1169 Type 2 diabetes mellitus with other specified complication: Secondary | ICD-10-CM | POA: Diagnosis not present

## 2020-03-06 ENCOUNTER — Ambulatory Visit: Payer: Medicare PPO | Attending: Internal Medicine

## 2020-03-06 DIAGNOSIS — E039 Hypothyroidism, unspecified: Secondary | ICD-10-CM | POA: Diagnosis not present

## 2020-03-06 DIAGNOSIS — E1169 Type 2 diabetes mellitus with other specified complication: Secondary | ICD-10-CM | POA: Diagnosis not present

## 2020-03-06 DIAGNOSIS — R609 Edema, unspecified: Secondary | ICD-10-CM | POA: Diagnosis not present

## 2020-03-06 DIAGNOSIS — I10 Essential (primary) hypertension: Secondary | ICD-10-CM | POA: Diagnosis not present

## 2020-03-06 DIAGNOSIS — R82998 Other abnormal findings in urine: Secondary | ICD-10-CM | POA: Diagnosis not present

## 2020-03-06 DIAGNOSIS — Z0000 Encounter for general adult medical examination without abnormal findings: Secondary | ICD-10-CM | POA: Diagnosis not present

## 2020-03-06 DIAGNOSIS — Z23 Encounter for immunization: Secondary | ICD-10-CM

## 2020-03-06 DIAGNOSIS — E119 Type 2 diabetes mellitus without complications: Secondary | ICD-10-CM | POA: Diagnosis not present

## 2020-03-06 DIAGNOSIS — Z6831 Body mass index (BMI) 31.0-31.9, adult: Secondary | ICD-10-CM | POA: Diagnosis not present

## 2020-03-06 NOTE — Progress Notes (Signed)
   Covid-19 Vaccination Clinic  Name:  Donna Valenzuela    MRN: [id number] DOB: 1945/12/24  03/06/2020  [name] was observed post Covid-19 immunization for 15 minutes without incident. She was provided with Vaccine Information Sheet and instruction to access the V-Safe system.   [name] was instructed to call 911 with any severe reactions post vaccine: Marland Kitchen Difficulty breathing  . Swelling of face and throat  . A fast heartbeat  . A bad rash all over body  . Dizziness and weakness   Immunizations Administered    Name Date Dose VIS Date Route   Pfizer COVID-19 Vaccine 03/06/2020  4:01 PM 0.3 mL 02/05/2020 Intramuscular   Manufacturer: [employer], [employer]   Lot: [id number]   NDC: [address]-5

## 2020-03-26 DIAGNOSIS — Z23 Encounter for immunization: Secondary | ICD-10-CM | POA: Diagnosis not present

## 2020-05-22 DIAGNOSIS — H43811 Vitreous degeneration, right eye: Secondary | ICD-10-CM | POA: Diagnosis not present

## 2020-05-22 DIAGNOSIS — H18513 Endothelial corneal dystrophy, bilateral: Secondary | ICD-10-CM | POA: Diagnosis not present

## 2020-05-22 DIAGNOSIS — E119 Type 2 diabetes mellitus without complications: Secondary | ICD-10-CM | POA: Diagnosis not present

## 2020-05-22 DIAGNOSIS — H2513 Age-related nuclear cataract, bilateral: Secondary | ICD-10-CM | POA: Diagnosis not present

## 2020-08-06 DIAGNOSIS — H2511 Age-related nuclear cataract, right eye: Secondary | ICD-10-CM | POA: Diagnosis not present

## 2020-08-06 DIAGNOSIS — H5703 Miosis: Secondary | ICD-10-CM | POA: Diagnosis not present

## 2020-08-24 ENCOUNTER — Other Ambulatory Visit: Payer: Self-pay | Admitting: Internal Medicine

## 2020-08-24 DIAGNOSIS — Z1231 Encounter for screening mammogram for malignant neoplasm of breast: Secondary | ICD-10-CM

## 2020-09-15 DIAGNOSIS — E039 Hypothyroidism, unspecified: Secondary | ICD-10-CM | POA: Diagnosis not present

## 2020-09-15 DIAGNOSIS — R21 Rash and other nonspecific skin eruption: Secondary | ICD-10-CM | POA: Diagnosis not present

## 2020-09-15 DIAGNOSIS — I10 Essential (primary) hypertension: Secondary | ICD-10-CM | POA: Diagnosis not present

## 2020-09-15 DIAGNOSIS — R609 Edema, unspecified: Secondary | ICD-10-CM | POA: Diagnosis not present

## 2020-09-15 DIAGNOSIS — E669 Obesity, unspecified: Secondary | ICD-10-CM | POA: Diagnosis not present

## 2020-09-15 DIAGNOSIS — E1169 Type 2 diabetes mellitus with other specified complication: Secondary | ICD-10-CM | POA: Diagnosis not present

## 2020-09-15 DIAGNOSIS — R002 Palpitations: Secondary | ICD-10-CM | POA: Diagnosis not present

## 2020-10-02 ENCOUNTER — Encounter: Payer: Self-pay | Admitting: Cardiology

## 2020-10-02 ENCOUNTER — Ambulatory Visit: Payer: Medicare PPO | Admitting: Cardiology

## 2020-10-02 ENCOUNTER — Other Ambulatory Visit: Payer: Self-pay

## 2020-10-02 VITALS — BP 137/80 | HR 89 | Temp 98.4°F | Resp 16 | Ht 62.0 in | Wt 172.0 lb

## 2020-10-02 DIAGNOSIS — I10 Essential (primary) hypertension: Secondary | ICD-10-CM | POA: Diagnosis not present

## 2020-10-02 DIAGNOSIS — R0989 Other specified symptoms and signs involving the circulatory and respiratory systems: Secondary | ICD-10-CM | POA: Diagnosis not present

## 2020-10-02 DIAGNOSIS — E119 Type 2 diabetes mellitus without complications: Secondary | ICD-10-CM

## 2020-10-02 DIAGNOSIS — R002 Palpitations: Secondary | ICD-10-CM

## 2020-10-02 DIAGNOSIS — E6609 Other obesity due to excess calories: Secondary | ICD-10-CM | POA: Diagnosis not present

## 2020-10-02 DIAGNOSIS — E782 Mixed hyperlipidemia: Secondary | ICD-10-CM | POA: Diagnosis not present

## 2020-10-02 DIAGNOSIS — Z6831 Body mass index (BMI) 31.0-31.9, adult: Secondary | ICD-10-CM | POA: Diagnosis not present

## 2020-10-02 DIAGNOSIS — E039 Hypothyroidism, unspecified: Secondary | ICD-10-CM

## 2020-10-02 NOTE — Progress Notes (Signed)
Date:  10/02/2020   ID:  Donna Valenzuela, DOB 10/22/1945, MRN [id number]  PCP:  Charlane Ferretti, DO  Cardiologist:  Tessa Lerner, DO, [facility] (established care 10/02/2020)  REASON FOR CONSULT: Palpitation   REQUESTING PHYSICIAN:  Nila Nephew, MD [address] [place],  [place] [address]  Chief Complaint  Patient presents with   Palpitations   New Patient (Initial Visit)    HPI  Donna Valenzuela is a 75 y.o. female who presents to the office with a chief complaint of " palpitations." Patient's past medical history and cardiovascular risk factors include: Hypertension, hyperlipidemia, diabetes, hypothyroidism, postmenopausal female.  She is referred to the office at the request of Nila Nephew, MD for evaluation of palpitations.  Palpitations: Patient states that the symptoms have been happening once a month, lasting a few seconds, not progressive, no lightheadedness or dizziness or syncopal events, no improving or worsening factors.  No new over-the-counter medications, illicit drugs, stimulants, energy drink consumptions, and herbal supplements.  Patient consumes 1 cup of caffeine per day.   Use to consume at least 24 ounces of caffeinated Pepsi on a daily basis.  Patient states that she is weaning down and is currently on 12 ounces per day.  Patient carries a history of hypothyroidism and has her TSH checked regularly.  Do not have any labs at today's visit to review.  No prior history of anemia.  No family history of premature coronary disease or sudden cardiac death.  FUNCTIONAL STATUS: No structured exercise program or daily routine.    ALLERGIES: Allergies  Allergen Reactions   Sulfonamide Derivatives     REACTION: cough eyes, turn red, voice changes    MEDICATION LIST PRIOR TO VISIT: Current Meds  Medication Sig   amLODipine (NORVASC) 5 MG tablet Take 5 mg by mouth daily.   atorvastatin (LIPITOR) 10 MG tablet Take 10 mg by mouth daily.    carvedilol (COREG) 25 MG tablet Take 25 mg by mouth 2 (two) times daily with a meal.   hydrochlorothiazide (HYDRODIURIL) 25 MG tablet Take 25 mg by mouth daily.   levothyroxine (SYNTHROID, LEVOTHROID) 100 MCG tablet Take 100 mcg by mouth daily.   metFORMIN (GLUCOPHAGE) 500 MG tablet Take 500 mg by mouth daily with breakfast.     PAST MEDICAL HISTORY: Past Medical History:  Diagnosis Date   Diabetes mellitus without complication (HCC)    Goiter    Hyperlipidemia    Hypertension     PAST SURGICAL HISTORY: Past Surgical History:  Procedure Laterality Date   ABDOMINAL HYSTERECTOMY     COLONOSCOPY  02/06/2008   Dr.Perry   PARTIAL HYSTERECTOMY      FAMILY HISTORY: The patient family history includes Cancer in her brother; Heart attack in her brother, brother, father, and mother; Hypertension in her brother, father, and sister.  SOCIAL HISTORY:  The patient  reports that she has never smoked. She has never used smokeless tobacco. She reports that she does not drink alcohol and does not use drugs.  REVIEW OF SYSTEMS: Review of Systems  Constitutional: Negative for chills and fever.  HENT:  Negative for hoarse voice and nosebleeds.   Eyes:  Negative for discharge, double vision and pain.  Cardiovascular:  Negative for chest pain, claudication, dyspnea on exertion, leg swelling, near-syncope, orthopnea, palpitations, paroxysmal nocturnal dyspnea and syncope.  Respiratory:  Negative for hemoptysis and shortness of breath.   Musculoskeletal:  Negative for muscle cramps and myalgias.  Gastrointestinal:  Negative for abdominal pain,  constipation, diarrhea, hematemesis, hematochezia, melena, nausea and vomiting.  Neurological:  Negative for dizziness and light-headedness.   PHYSICAL EXAM: Vitals with BMI 10/02/2020 04/16/2018 04/16/2018  Height 5\' 2"  - -  Weight 172 lbs - -  BMI 31.45 - -  Systolic 137 130  Diastolic 80 70 73  Pulse 89 72 74    CONSTITUTIONAL: Well-developed  and well-nourished. No acute distress.  SKIN: Skin is warm and dry. No rash noted. No cyanosis. No pallor. No jaundice HEAD: Normocephalic and atraumatic.  EYES: No scleral icterus MOUTH/THROAT: Moist oral membranes.  NECK: No JVD present. No thyromegaly noted.  Right carotid bruit. LYMPHATIC: No visible cervical adenopathy.  CHEST Normal respiratory effort. No intercostal retractions  LUNGS: Clear to auscultation bilaterally.  No stridor. No wheezes. No rales.  CARDIOVASCULAR: Regular rate and rhythm, positive S1-S2, no murmurs rubs or gallops appreciated. ABDOMINAL: No apparent ascites.  EXTREMITIES: No peripheral edema  HEMATOLOGIC: No significant bruising NEUROLOGIC: Oriented to person, place, and time. Nonfocal. Normal muscle tone.  PSYCHIATRIC: Normal mood and affect. Normal behavior. Cooperative  CARDIAC DATABASE: EKG: 10/02/2020: NSR, 77bpm, LAE, LVH per voltage criteria, without underlying injury pattern.  Echocardiogram: No results found for this or any previous visit from the past 1095 days.   Stress Testing: No results found for this or any previous visit from the past 1095 days.  Heart Catheterization: None   LABORATORY DATA: CBC Latest Ref Rng & Units 07/15/2012 03/05/2008 03/04/2008  WBC 4.0 - 10.5 K/uL 5.1 11.4(H) -  Hemoglobin 12.0 - 15.0 g/dL 03/06/2008 27.2 16.3(H)  Hematocrit 36.0 - 46.0 % 41.7 39.8 48.0(H)  Platelets 150 - 400 K/uL 183 207 -    CMP Latest Ref Rng & Units 07/15/2012 03/05/2008 03/04/2008  Glucose 70 - 99 mg/dL 03/06/2008) 644(I) 347(Q)  BUN 6 - 23 mg/dL 19 10 -  Creatinine 259(D - 1.10 mg/dL 6.38 7.56 -  Sodium 4.33 - 145 mEq/L 138 137 141  Potassium 3.5 - 5.1 mEq/L 2.7(LL) 4.2 REPEATED TO VERIFY DELTA CHECK NOTED 3.9  Chloride 96 - 112 mEq/L 97 104 -  CO2 19 - 32 mEq/L 29 27 -  Calcium 8.4 - 10.5 mg/dL 8.7 8.4 -  Total Protein 6.0 - 8.3 g/dL 7.6 - -  Total Bilirubin 0.3 - 1.2 mg/dL 0.7 - -  Alkaline Phos 39 - 117 U/L 101 - -  AST 0 - 37 U/L 18 -  -  ALT 0 - 35 U/L 14 - -    Lipid Panel  No results found for: CHOL, TRIG, HDL, CHOLHDL, VLDL, LDLCALC, LDLDIRECT, LABVLDL  No components found for: NTPROBNP No results for input(s): PROBNP in the last 8760 hours. No results for input(s): TSH in the last 8760 hours.  BMP No results for input(s): NA, K, CL, CO2, GLUCOSE, BUN, CREATININE, CALCIUM, GFRNONAA, GFRAA in the last 8760 hours.  HEMOGLOBIN A1C No results found for: HGBA1C, MPG  IMPRESSION:    ICD-10-CM   1. Palpitations  R00.2 EKG 12-Lead    2. Benign hypertension  I10     3. Hypothyroidism, unspecified type  E03.9     4. Non-insulin dependent type 2 diabetes mellitus (HCC)  E11.9     5. Mixed hyperlipidemia  E78.2     6. Class 1 obesity due to excess calories with serious comorbidity and body mass index (BMI) of 31.0 to 31.9 in adult  E66.09    Z68.31        RECOMMENDATIONS: Donna Valenzuela is a  75 y.o. female whose past medical history and cardiac risk factors include: Hypertension, hyperlipidemia, diabetes, hypothyroidism, postmenopausal female.  Palpitations: Patient states that the symptoms are very sporadic and occur once a month and last for a few seconds. She had recent labs at her PCP, will request records. Would like to evaluate her electrolytes and TSH. We discussed proceeding with an extended Holter monitor; however, given his symptoms are so infrequent that this shared decision was to hold off on additional testing at this time.  She will call the office if symptoms get worse in intensity, frequency, and/or duration.  Right carotid bruit: Carotid duplex to evaluate for carotid artery atherosclerosis.  Non-insulin-dependent diabetes mellitus type 2: Currently managed by primary care provider.  Mixed hyperlipidemia: Currently on statin therapy.  Currently managed by primary care provider.  Given her age and multiple cardiovascular risk factors that shared decision was to proceed with an  echocardiogram to evaluate for structural heart disease and LVEF.  In addition patient would like to have a GXT to evaluate for exercise-induced ischemia prior to going back to the gym and working out like she used to before the COVID pandemic.  Will request labs from her PCP prior to the next office visit.  FINAL MEDICATION LIST END OF ENCOUNTER: No orders of the defined types were placed in this encounter.   There are no discontinued medications.   Current Outpatient Medications:    amLODipine (NORVASC) 5 MG tablet, Take 5 mg by mouth daily., Disp: , Rfl:    atorvastatin (LIPITOR) 10 MG tablet, Take 10 mg by mouth daily., Disp: , Rfl:    carvedilol (COREG) 25 MG tablet, Take 25 mg by mouth 2 (two) times daily with a meal., Disp: , Rfl:    hydrochlorothiazide (HYDRODIURIL) 25 MG tablet, Take 25 mg by mouth daily., Disp: , Rfl:    levothyroxine (SYNTHROID, LEVOTHROID) 100 MCG tablet, Take 100 mcg by mouth daily., Disp: , Rfl:    metFORMIN (GLUCOPHAGE) 500 MG tablet, Take 500 mg by mouth daily with breakfast., Disp: , Rfl:   Orders Placed This Encounter  Procedures   EKG 12-Lead    There are no Patient Instructions on file for this visit.   --Continue cardiac medications as reconciled in final medication list. --No follow-ups on file. Or sooner if needed. --Continue follow-up with your primary care physician regarding the management of your other chronic comorbid conditions.  Patient's questions and concerns were addressed to her satisfaction. She voices understanding of the instructions provided during this encounter.   This note was created using a voice recognition software as a result there may be grammatical errors inadvertently enclosed that do not reflect the nature of this encounter. Every attempt is made to correct such errors.  Tessa Lerner, [place], [facility]  Pager: [phone] Office: [phone]

## 2020-10-13 ENCOUNTER — Ambulatory Visit: Payer: Medicare PPO

## 2020-10-13 ENCOUNTER — Other Ambulatory Visit: Payer: Self-pay

## 2020-10-13 DIAGNOSIS — I10 Essential (primary) hypertension: Secondary | ICD-10-CM | POA: Diagnosis not present

## 2020-10-13 DIAGNOSIS — R0989 Other specified symptoms and signs involving the circulatory and respiratory systems: Secondary | ICD-10-CM

## 2020-10-20 ENCOUNTER — Ambulatory Visit
Admission: RE | Admit: 2020-10-20 | Discharge: 2020-10-20 | Disposition: A | Discharge status: Home / Self Care | Payer: Medicare PPO | Source: Ambulatory Visit | Attending: Internal Medicine | Admitting: Internal Medicine

## 2020-10-20 ENCOUNTER — Other Ambulatory Visit: Payer: Self-pay

## 2020-10-20 DIAGNOSIS — Z1231 Encounter for screening mammogram for malignant neoplasm of breast: Secondary | ICD-10-CM | POA: Diagnosis not present

## 2020-10-20 NOTE — Progress Notes (Signed)
Called patient, NA, LMAM  ... patient has another message in queue, please make sure to address as well.

## 2020-10-20 NOTE — Progress Notes (Signed)
Patient called back, I have discussed results with her.

## 2020-10-20 NOTE — Progress Notes (Signed)
Called patient, NA, LMAM  ... patient has another message in queue, please make sure to address as well.

## 2020-10-23 ENCOUNTER — Other Ambulatory Visit: Payer: Self-pay

## 2020-10-23 ENCOUNTER — Ambulatory Visit: Payer: Medicare PPO

## 2020-10-23 DIAGNOSIS — E119 Type 2 diabetes mellitus without complications: Secondary | ICD-10-CM

## 2020-10-23 DIAGNOSIS — R002 Palpitations: Secondary | ICD-10-CM | POA: Diagnosis not present

## 2020-10-23 DIAGNOSIS — I10 Essential (primary) hypertension: Secondary | ICD-10-CM | POA: Diagnosis not present

## 2020-10-23 DIAGNOSIS — R0989 Other specified symptoms and signs involving the circulatory and respiratory systems: Secondary | ICD-10-CM | POA: Diagnosis not present

## 2020-11-13 ENCOUNTER — Other Ambulatory Visit: Payer: Self-pay

## 2020-11-13 ENCOUNTER — Ambulatory Visit: Payer: Medicare PPO | Admitting: Cardiology

## 2020-11-13 ENCOUNTER — Encounter: Payer: Self-pay | Admitting: Cardiology

## 2020-11-13 VITALS — BP 131/74 | HR 83 | Temp 96.8°F | Ht 62.0 in | Wt 170.8 lb

## 2020-11-13 DIAGNOSIS — E6609 Other obesity due to excess calories: Secondary | ICD-10-CM | POA: Diagnosis not present

## 2020-11-13 DIAGNOSIS — R9439 Abnormal result of other cardiovascular function study: Secondary | ICD-10-CM | POA: Diagnosis not present

## 2020-11-13 DIAGNOSIS — R002 Palpitations: Secondary | ICD-10-CM | POA: Diagnosis not present

## 2020-11-13 DIAGNOSIS — I493 Ventricular premature depolarization: Secondary | ICD-10-CM

## 2020-11-13 DIAGNOSIS — Z712 Person consulting for explanation of examination or test findings: Secondary | ICD-10-CM

## 2020-11-13 DIAGNOSIS — I10 Essential (primary) hypertension: Secondary | ICD-10-CM | POA: Diagnosis not present

## 2020-11-13 DIAGNOSIS — E782 Mixed hyperlipidemia: Secondary | ICD-10-CM

## 2020-11-13 DIAGNOSIS — E119 Type 2 diabetes mellitus without complications: Secondary | ICD-10-CM

## 2020-11-13 DIAGNOSIS — E039 Hypothyroidism, unspecified: Secondary | ICD-10-CM

## 2020-11-13 NOTE — Progress Notes (Signed)
Date:  11/13/2020   ID:  Donna Valenzuela, DOB 05-Jul-1945, MRN [id number]  PCP:  Charlane Ferretti, DO  Cardiologist:  Tessa Lerner, DO, [facility] (established care 10/02/2020)  Date: 11/13/20 Last Office Visit: 10/02/2020  Chief Complaint  Patient presents with   Palpitations   Results    HPI  Donna Valenzuela is a 75 y.o. female who presents to the office with a chief complaint of " palpitations and review test results." Patient's past medical history and cardiovascular risk factors include: Hypertension, hyperlipidemia, diabetes, hypothyroidism, postmenopausal female.  She is referred to the office at the request of Charlane Ferretti, DO for evaluation of palpitations.  Patient was referred for the evaluation of palpitations.  However, at the last office visit patient noted that her symptoms of palpitation have improved and they occur once a month.  Since her symptoms are so infrequent that shared decision was to hold off on performing a Holter monitor as it may be low yield.  However, given her cardiovascular risk factors and estimated 10-year risk of ASCVD the shared decision was to proceed with an ischemic evaluation for risk stratification.  She underwent an echocardiogram and stress test results reviewed with her in great detail and noted below for further reference.  She was also noted to have a carotid bruit on physical examination and subsequently did undergo carotid duplex which notes no significant stenosis but tortuous right external carotid artery which may be contributing to her carotid bruit.  Clinically she remains stable since last office visit.  Denies any anginal discomfort or heart failure symptoms.  FUNCTIONAL STATUS: No structured exercise program or daily routine.    ALLERGIES: Allergies  Allergen Reactions   Sulfonamide Derivatives     REACTION: cough eyes, turn red, voice changes    MEDICATION LIST PRIOR TO VISIT: Current Meds  Medication Sig   amLODipine  (NORVASC) 5 MG tablet Take 5 mg by mouth daily.   atorvastatin (LIPITOR) 10 MG tablet Take 10 mg by mouth daily.   carvedilol (COREG) 25 MG tablet Take 25 mg by mouth 2 (two) times daily with a meal.   hydrochlorothiazide (HYDRODIURIL) 25 MG tablet Take 25 mg by mouth daily.   levothyroxine (SYNTHROID, LEVOTHROID) 100 MCG tablet Take 100 mcg by mouth daily.   metFORMIN (GLUCOPHAGE) 500 MG tablet Take 500 mg by mouth daily with breakfast.     PAST MEDICAL HISTORY: Past Medical History:  Diagnosis Date   Diabetes mellitus without complication (HCC)    Goiter    Hyperlipidemia    Hypertension     PAST SURGICAL HISTORY: Past Surgical History:  Procedure Laterality Date   ABDOMINAL HYSTERECTOMY     COLONOSCOPY  02/06/2008   Dr.Perry    FAMILY HISTORY: The patient family history includes Cancer in her brother; Heart attack in her brother, brother, father, and mother; Hypertension in her brother, father, and sister.  SOCIAL HISTORY:  The patient  reports that she has never smoked. She has never used smokeless tobacco. She reports that she does not drink alcohol and does not use drugs.  REVIEW OF SYSTEMS: Review of Systems  Constitutional: Negative for chills and fever.  HENT:  Negative for hoarse voice and nosebleeds.   Eyes:  Negative for discharge, double vision and pain.  Cardiovascular:  Negative for chest pain, claudication, dyspnea on exertion, leg swelling, near-syncope, orthopnea, palpitations, paroxysmal nocturnal dyspnea and syncope.  Respiratory:  Negative for hemoptysis and shortness of breath.   Musculoskeletal:  Negative for muscle  cramps and myalgias.  Gastrointestinal:  Negative for abdominal pain, constipation, diarrhea, hematemesis, hematochezia, melena, nausea and vomiting.  Neurological:  Negative for dizziness and light-headedness.   PHYSICAL EXAM: Vitals with BMI 11/13/2020 10/02/2020 04/16/2018  Height 5\' 2"  5\' 2"  -  Weight 170 lbs 13 oz 172 lbs -  BMI  31.23 31.45 -  Systolic 131 137  Diastolic 74 80 70  Pulse 83 89 72    CONSTITUTIONAL: Well-developed and well-nourished. No acute distress.  SKIN: Skin is warm and dry. No rash noted. No cyanosis. No pallor. No jaundice HEAD: Normocephalic and atraumatic.  EYES: No scleral icterus MOUTH/THROAT: Moist oral membranes.  NECK: No JVD present. No thyromegaly noted.  Right carotid bruit. LYMPHATIC: No visible cervical adenopathy.  CHEST Normal respiratory effort. No intercostal retractions  LUNGS: Clear to auscultation bilaterally.  No stridor. No wheezes. No rales.  CARDIOVASCULAR: Regular rate and rhythm, positive S1-S2, no murmurs rubs or gallops appreciated. ABDOMINAL: No apparent ascites.  EXTREMITIES: No peripheral edema  HEMATOLOGIC: No significant bruising NEUROLOGIC: Oriented to person, place, and time. Nonfocal. Normal muscle tone.  PSYCHIATRIC: Normal mood and affect. Normal behavior. Cooperative  CARDIAC DATABASE: EKG: 10/02/2020: NSR, 77bpm, LAE, LVH per voltage criteria, without underlying injury pattern.  Echocardiogram: 10/13/2020: Left ventricle cavity is normal in size. Moderate concentric hypertrophy of the left ventricle. Normal global wall motion. Normal LV systolic function with visual EF 50-55%. Doppler evidence of grade I (impaired) diastolic dysfunction, normal LAP. Trileaflet aortic valve.  Trace aortic regurgitation. Moderate (Grade II) mitral regurgitation. Mild tricuspid regurgitation. No evidence of pulmonary hypertension.   Stress Testing: Exercise treadmill stress test 10/23/2020:  Functional status: Fair/low Chest pain: No. Reason for stopping exercise: Fatigue/weakness. Hypertensive response to exercise: No. Exercise time 3 minutes 22 seconds on Bruce protocol, achieved 5.07 METS, and 94% of age-predicted maximum heart rate. Stress ECG negative for ischemia. Intermediate risk study (fair/low functional capacity for age, frequent PVCs at rest  and atleast 2 minutes into recovery). Clinical correlation required.  Heart Catheterization: None   Carotid artery duplex 10/13/2020:  No hemodynamically significant arterial disease in the internal carotid artery bilaterally. Right external carotid artery is tortuous and could be the source of bruit.  Antegrade right vertebral artery flow. Antegrade left vertebral artery flow.  LABORATORY DATA: CBC Latest Ref Rng & Units 07/15/2012 03/05/2008 03/04/2008  WBC 4.0 - 10.5 K/uL 5.1 11.4(H) -  Hemoglobin 12.0 - 15.0 g/dL 03/07/2008 03/06/2008 16.3(H)  Hematocrit 36.0 - 46.0 % 41.7 39.8 48.0(H)  Platelets 150 - 400 K/uL 183 207 -    CMP Latest Ref Rng & Units 07/15/2012 03/05/2008 03/04/2008  Glucose 70 - 99 mg/dL 03/07/2008) 03/06/2008) 782(N)  BUN 6 - 23 mg/dL 19 10 -  Creatinine 562(Z - 1.10 mg/dL 308(M 5.78 -  Sodium 4.69 - 145 mEq/L 138 137 141  Potassium 3.5 - 5.1 mEq/L 2.7(LL) 4.2 REPEATED TO VERIFY DELTA CHECK NOTED 3.9  Chloride 96 - 112 mEq/L 97 104 -  CO2 19 - 32 mEq/L 29 27 -  Calcium 8.4 - 10.5 mg/dL 8.7 8.4 -  Total Protein 6.0 - 8.3 g/dL 7.6 - -  Total Bilirubin 0.3 - 1.2 mg/dL 0.7 - -  Alkaline Phos 39 - 117 U/L 101 - -  AST 0 - 37 U/L 18 - -  ALT 0 - 35 U/L 14 - -    Lipid Panel  No results found for: CHOL, TRIG, HDL, CHOLHDL, VLDL, LDLCALC, LDLDIRECT, LABVLDL  No components found for:  NTPROBNP No results for input(s): PROBNP in the last 8760 hours. No results for input(s): TSH in the last 8760 hours.  BMP No results for input(s): NA, K, CL, CO2, GLUCOSE, BUN, CREATININE, CALCIUM, GFRNONAA, GFRAA in the last 8760 hours.  HEMOGLOBIN A1C No results found for: HGBA1C, MPG  IMPRESSION:    ICD-10-CM   1. Premature ventricular contractions  I49.3     2. Palpitations  R00.2     3. Equivocal stress test  R94.39 PCV MYOCARDIAL PERFUSION WITH LEXISCAN    4. Encounter to discuss test results  Z71.2     5. Benign hypertension  I10     6. Hypothyroidism, unspecified type  E03.9      7. Non-insulin dependent type 2 diabetes mellitus (HCC)  E11.9     8. Mixed hyperlipidemia  E78.2     9. Class 1 obesity due to excess calories with serious comorbidity and body mass index (BMI) of 31.0 to 31.9 in adult  E66.09    Z68.31        RECOMMENDATIONS: Donna Valenzuela is a 75 y.o. female whose past medical history and cardiac risk factors include: Hypertension, hyperlipidemia, diabetes, hypothyroidism, postmenopausal female.  Palpitations: Stable. Since his symptoms are so infrequent/sporadic shared decision was to hold off on Holter monitor for now as it may be low yield.   Patient states that the symptoms are very sporadic and occur once a month and last for a few seconds. On stress test patient was noted to have PVCs at rest and also into recovery.  I suspect the palpitations that she is referring to may be secondary to premature ventricular contractions. Patient is already on carvedilol 25 mg p.o. twice daily.  Will monitor for now.  Right carotid bruit: Reviewed the results of the carotid duplex.  Most likely secondary to tortuous right external carotid artery.  Equivocal exercise treadmill stress test: Patient illustrated low functional capacity on the recent GXT.  Based on the workload performed overall the stress ECG was negative for ischemia.  But she had PVCs both at rest and more than 2 minutes into recovery.  The shared decision is to proceed with Lexiscan to evaluate for reversible ischemia.  As long as the nuclear stress test is considered to be a low risk I will see her back on an annual basis or sooner if change in clinical status or an abnormal stress test.  Non-insulin-dependent diabetes mellitus type 2: Currently managed by primary care provider.  Mixed hyperlipidemia: Currently on statin therapy.  Currently managed by primary care provider.  FINAL MEDICATION LIST END OF ENCOUNTER: No orders of the defined types were placed in this encounter.   There  are no discontinued medications.   Current Outpatient Medications:    amLODipine (NORVASC) 5 MG tablet, Take 5 mg by mouth daily., Disp: , Rfl:    atorvastatin (LIPITOR) 10 MG tablet, Take 10 mg by mouth daily., Disp: , Rfl:    carvedilol (COREG) 25 MG tablet, Take 25 mg by mouth 2 (two) times daily with a meal., Disp: , Rfl:    hydrochlorothiazide (HYDRODIURIL) 25 MG tablet, Take 25 mg by mouth daily., Disp: , Rfl:    levothyroxine (SYNTHROID, LEVOTHROID) 100 MCG tablet, Take 100 mcg by mouth daily., Disp: , Rfl:    metFORMIN (GLUCOPHAGE) 500 MG tablet, Take 500 mg by mouth daily with breakfast., Disp: , Rfl:   Orders Placed This Encounter  Procedures   PCV MYOCARDIAL PERFUSION WITH LEXISCAN   There  are no Patient Instructions on file for this visit.   --Continue cardiac medications as reconciled in final medication list. --Return in about 1 year (around 11/13/2021) for Follow up. Or sooner if needed. --Continue follow-up with your primary care physician regarding the management of your other chronic comorbid conditions.  Patient's questions and concerns were addressed to her satisfaction. She voices understanding of the instructions provided during this encounter.   This note was created using a voice recognition software as a result there may be grammatical errors inadvertently enclosed that do not reflect the nature of this encounter. Every attempt is made to correct such errors.  Tessa LernerSunit Jude Naclerio, [place]DO, [facility] - [facility] RougeFACC  Pager: [phone] Office: [phone]

## 2020-11-30 ENCOUNTER — Ambulatory Visit: Payer: Medicare PPO

## 2020-11-30 ENCOUNTER — Other Ambulatory Visit: Payer: Self-pay

## 2020-11-30 DIAGNOSIS — R9439 Abnormal result of other cardiovascular function study: Secondary | ICD-10-CM

## 2020-12-03 ENCOUNTER — Telehealth: Payer: Self-pay | Admitting: Cardiology

## 2020-12-03 NOTE — Telephone Encounter (Signed)
Done

## 2020-12-03 NOTE — Progress Notes (Signed)
Called patient, NA, LMAM

## 2020-12-03 NOTE — Progress Notes (Signed)
Pt called back. Spoke to pt regarding stress test results. Pt voiced understanding.

## 2020-12-03 NOTE — Telephone Encounter (Signed)
Pt called wanting to know results of her stress test

## 2021-01-12 DIAGNOSIS — E119 Type 2 diabetes mellitus without complications: Secondary | ICD-10-CM | POA: Diagnosis not present

## 2021-01-12 DIAGNOSIS — H43811 Vitreous degeneration, right eye: Secondary | ICD-10-CM | POA: Diagnosis not present

## 2021-01-12 DIAGNOSIS — H18513 Endothelial corneal dystrophy, bilateral: Secondary | ICD-10-CM | POA: Diagnosis not present

## 2021-01-12 DIAGNOSIS — Z961 Presence of intraocular lens: Secondary | ICD-10-CM | POA: Diagnosis not present

## 2021-01-12 DIAGNOSIS — H2512 Age-related nuclear cataract, left eye: Secondary | ICD-10-CM | POA: Diagnosis not present

## 2021-01-25 ENCOUNTER — Other Ambulatory Visit: Payer: Self-pay

## 2021-01-25 ENCOUNTER — Encounter: Payer: Self-pay | Admitting: Podiatry

## 2021-01-25 ENCOUNTER — Ambulatory Visit (INDEPENDENT_AMBULATORY_CARE_PROVIDER_SITE_OTHER): Payer: Medicare PPO | Admitting: Podiatry

## 2021-01-25 ENCOUNTER — Ambulatory Visit (INDEPENDENT_AMBULATORY_CARE_PROVIDER_SITE_OTHER): Payer: Medicare PPO

## 2021-01-25 DIAGNOSIS — M722 Plantar fascial fibromatosis: Secondary | ICD-10-CM

## 2021-01-25 DIAGNOSIS — M7731 Calcaneal spur, right foot: Secondary | ICD-10-CM

## 2021-01-25 NOTE — Patient Instructions (Signed)
Look for silicone heel cups for plantar fasciitis in the foot care section of the pharmacy or [employer]   Plantar Fasciitis (Heel Spur Syndrome) with Rehab The plantar fascia is a fibrous, ligament-like, soft-tissue structure that spans the bottom of the foot. Plantar fasciitis is a condition that causes pain in the foot due to inflammation of the tissue. SYMPTOMS  Pain and tenderness on the underneath side of the foot. Pain that worsens with standing or walking. CAUSES  Plantar fasciitis is caused by irritation and injury to the plantar fascia on the underneath side of the foot. Common mechanisms of injury include: Direct trauma to bottom of the foot. Damage to a small nerve that runs under the foot where the main fascia attaches to the heel bone. Stress placed on the plantar fascia due to bone spurs. RISK INCREASES WITH:  Activities that place stress on the plantar fascia (running, jumping, pivoting, or cutting). Poor strength and flexibility. Improperly fitted shoes. Tight calf muscles. Flat feet. Failure to warm-up properly before activity. Obesity. PREVENTION Warm up and stretch properly before activity. Allow for adequate recovery between workouts. Maintain physical fitness: Strength, flexibility, and endurance. Cardiovascular fitness. Maintain a health body weight. Avoid stress on the plantar fascia. Wear properly fitted shoes, including arch supports for individuals who have flat feet.  PROGNOSIS  If treated properly, then the symptoms of plantar fasciitis usually resolve without surgery. However, occasionally surgery is necessary.  RELATED COMPLICATIONS  Recurrent symptoms that may result in a chronic condition. Problems of the lower back that are caused by compensating for the injury, such as limping. Pain or weakness of the foot during push-off following surgery. Chronic inflammation, scarring, and partial or complete fascia tear, occurring more often from repeated  injections.  TREATMENT  Treatment initially involves the use of ice and medication to help reduce pain and inflammation. The use of strengthening and stretching exercises may help reduce pain with activity, especially stretches of the Achilles tendon. These exercises may be performed at home or with a therapist. Your caregiver may recommend that you use heel cups of arch supports to help reduce stress on the plantar fascia. Occasionally, corticosteroid injections are given to reduce inflammation. If symptoms persist for greater than 6 months despite non-surgical (conservative), then surgery may be recommended.   MEDICATION  If pain medication is necessary, then nonsteroidal anti-inflammatory medications, such as aspirin and ibuprofen, or other minor pain relievers, such as acetaminophen, are often recommended. Do not take pain medication within 7 days before surgery. Prescription pain relievers may be given if deemed necessary by your caregiver. Use only as directed and only as much as you need. Corticosteroid injections may be given by your caregiver. These injections should be reserved for the most serious cases, because they may only be given a certain number of times.  HEAT AND COLD Cold treatment (icing) relieves pain and reduces inflammation. Cold treatment should be applied for 10 to 15 minutes every 2 to 3 hours for inflammation and pain and immediately after any activity that aggravates your symptoms. Use ice packs or massage the area with a piece of ice (ice massage). Heat treatment may be used prior to performing the stretching and strengthening activities prescribed by your caregiver, physical therapist, or athletic trainer. Use a heat pack or soak the injury in warm water.  SEEK IMMEDIATE MEDICAL CARE IF: Treatment seems to offer no benefit, or the condition worsens. Any medications produce adverse side effects.  EXERCISES- RANGE OF MOTION (ROM) AND STRETCHING  EXERCISES - Plantar  Fasciitis (Heel Spur Syndrome) These exercises may help you when beginning to rehabilitate your injury. Your symptoms may resolve with or without further involvement from your physician, physical therapist or athletic trainer. While completing these exercises, remember:  Restoring tissue flexibility helps normal motion to return to the joints. This allows healthier, less painful movement and activity. An effective stretch should be held for at least 30 seconds. A stretch should never be painful. You should only feel a gentle lengthening or release in the stretched tissue.  RANGE OF MOTION - Toe Extension, Flexion Sit with your right / left leg crossed over your opposite knee. Grasp your toes and gently pull them back toward the top of your foot. You should feel a stretch on the bottom of your toes and/or foot. Hold this stretch for 10 seconds. Now, gently pull your toes toward the bottom of your foot. You should feel a stretch on the top of your toes and or foot. Hold this stretch for 10 seconds. Repeat  times. Complete this stretch 3 times per day.   RANGE OF MOTION - Ankle Dorsiflexion, Active Assisted Remove shoes and sit on a chair that is preferably not on a carpeted surface. Place right / left foot under knee. Extend your opposite leg for support. Keeping your heel down, slide your right / left foot back toward the chair until you feel a stretch at your ankle or calf. If you do not feel a stretch, slide your bottom forward to the edge of the chair, while still keeping your heel down. Hold this stretch for 10 seconds. Repeat 3 times. Complete this stretch 2 times per day.   STRETCH  Gastroc, Standing Place hands on wall. Extend right / left leg, keeping the front knee somewhat bent. Slightly point your toes inward on your back foot. Keeping your right / left heel on the floor and your knee straight, shift your weight toward the wall, not allowing your back to arch. You should feel a  gentle stretch in the right / left calf. Hold this position for 10 seconds. Repeat 3 times. Complete this stretch 2 times per day.  STRETCH  Soleus, Standing Place hands on wall. Extend right / left leg, keeping the other knee somewhat bent. Slightly point your toes inward on your back foot. Keep your right / left heel on the floor, bend your back knee, and slightly shift your weight over the back leg so that you feel a gentle stretch deep in your back calf. Hold this position for 10 seconds. Repeat 3 times. Complete this stretch 2 times per day.  STRETCH  Gastrocsoleus, Standing  Note: This exercise can place a lot of stress on your foot and ankle. Please complete this exercise only if specifically instructed by your caregiver.  Place the ball of your right / left foot on a step, keeping your other foot firmly on the same step. Hold on to the wall or a rail for balance. Slowly lift your other foot, allowing your body weight to press your heel down over the edge of the step. You should feel a stretch in your right / left calf. Hold this position for 10 seconds. Repeat this exercise with a slight bend in your right / left knee. Repeat 3 times. Complete this stretch 2 times per day.   STRENGTHENING EXERCISES - Plantar Fasciitis (Heel Spur Syndrome)  These exercises may help you when beginning to rehabilitate your injury. They may resolve your symptoms with  or without further involvement from your physician, physical therapist or athletic trainer. While completing these exercises, remember:  Muscles can gain both the endurance and the strength needed for everyday activities through controlled exercises. Complete these exercises as instructed by your physician, physical therapist or athletic trainer. Progress the resistance and repetitions only as guided.  STRENGTH - Towel Curls Sit in a chair positioned on a non-carpeted surface. Place your foot on a towel, keeping your heel on the  floor. Pull the towel toward your heel by only curling your toes. Keep your heel on the floor. Repeat 3 times. Complete this exercise 2 times per day.  STRENGTH - Ankle Inversion Secure one end of a rubber exercise band/tubing to a fixed object (table, pole). Loop the other end around your foot just before your toes. Place your fists between your knees. This will focus your strengthening at your ankle. Slowly, pull your big toe up and in, making sure the band/tubing is positioned to resist the entire motion. Hold this position for 10 seconds. Have your muscles resist the band/tubing as it slowly pulls your foot back to the starting position. Repeat 3 times. Complete this exercises 2 times per day.  Document Released: 04/04/2005 Document Revised: 06/27/2011 Document Reviewed: 07/17/2008 [facility] Patient Information 2014 [place], [place].

## 2021-01-26 NOTE — Progress Notes (Signed)
  Subjective:  Patient ID: Donna Valenzuela, female    DOB: 05-02-1945,  MRN: [id number]  Chief Complaint  Patient presents with   Callouses     (np) place on the right heel/painful    75 y.o. female presents with the above complaint. History confirmed with patient.  She has had painful right heel for the last few weeks.  She is very active and likes to walk for exercise but has unable do this because of the pain.  No injury that she knows of.  Her son is a patient of mine.  Objective:  Physical Exam: warm, good capillary refill, no trophic changes or ulcerative lesions, normal DP and PT pulses, and normal sensory exam.  Right Foot: point tenderness over the heel pad and point tenderness of the mid plantar fascia  No images are attached to the encounter.  Radiographs: Multiple views x-ray of the right foot: no fracture, dislocation, swelling or degenerative changes noted, there is large prominence of the plantar calcaneal tubercle no spurring Assessment:   1. Plantar fasciitis of right foot   2. Calcaneal spur of right foot      Plan:  Patient was evaluated and treated and all questions answered.  Discussed the etiology and treatment options for plantar fasciitis including stretching, formal physical therapy, supportive shoegears such as a running shoe or sneaker, pre fabricated orthoses, injection therapy, and oral medications. We also discussed the role of surgical treatment of this for patients who do not improve after exhausting non-surgical treatment options.   -XR reviewed with patient -Educated patient on stretching and icing of the affected limb -Recommended gel offloading heel cups. -We discussed injection therapy but she would like to hold off on this and see if she does her stretching and offloading with a gel pad first.  If not improving by next visit we will plan to inject.  Return in about 1 month (around 02/25/2021) for recheck heel spur and plantar fasciitis.

## 2021-02-25 ENCOUNTER — Other Ambulatory Visit: Payer: Self-pay

## 2021-02-25 ENCOUNTER — Ambulatory Visit (INDEPENDENT_AMBULATORY_CARE_PROVIDER_SITE_OTHER): Payer: Medicare PPO | Admitting: Podiatry

## 2021-02-25 DIAGNOSIS — M722 Plantar fascial fibromatosis: Secondary | ICD-10-CM | POA: Diagnosis not present

## 2021-03-01 NOTE — Progress Notes (Signed)
  Subjective:  Patient ID: Donna Valenzuela, female    DOB: 1945/04/30,  MRN: [id number]  Chief Complaint  Patient presents with   Plantar Fasciitis    Patient reports symptoms have improved since last office visit. Patient also using gel heel cups as recommended.     75 y.o. female presents with the above complaint. History confirmed with patient.  Doing much better she been doing her stretching exercises and wearing heel cups  Objective:  Physical Exam: warm, good capillary refill, no trophic changes or ulcerative lesions, normal DP and PT pulses, and normal sensory exam.  Right Foot: No pain in the heel today  No images are attached to the encounter.  Radiographs: Multiple views x-ray of the right foot: no fracture, dislocation, swelling or degenerative changes noted, there is large prominence of the plantar calcaneal tubercle no spurring Assessment:   1. Plantar fasciitis of right foot       Plan:  Patient was evaluated and treated and all questions answered.  Discussed the etiology and treatment options for plantar fasciitis including stretching, formal physical therapy, supportive shoegears such as a running shoe or sneaker, pre fabricated orthoses, injection therapy, and oral medications. We also discussed the role of surgical treatment of this for patients who do not improve after exhausting non-surgical treatment options.   Overall doing much better with her planter fasciitis, she can continue her stretching exercises ~is pain-free for 2 weeks and then return to see me as needed for this or other issues  Return if symptoms worsen or fail to improve.

## 2021-03-09 DIAGNOSIS — I10 Essential (primary) hypertension: Secondary | ICD-10-CM | POA: Diagnosis not present

## 2021-03-09 DIAGNOSIS — E039 Hypothyroidism, unspecified: Secondary | ICD-10-CM | POA: Diagnosis not present

## 2021-03-09 DIAGNOSIS — Z23 Encounter for immunization: Secondary | ICD-10-CM | POA: Diagnosis not present

## 2021-03-09 DIAGNOSIS — E1169 Type 2 diabetes mellitus with other specified complication: Secondary | ICD-10-CM | POA: Diagnosis not present

## 2021-03-16 DIAGNOSIS — R21 Rash and other nonspecific skin eruption: Secondary | ICD-10-CM | POA: Diagnosis not present

## 2021-03-16 DIAGNOSIS — Z1331 Encounter for screening for depression: Secondary | ICD-10-CM | POA: Diagnosis not present

## 2021-03-16 DIAGNOSIS — R002 Palpitations: Secondary | ICD-10-CM | POA: Diagnosis not present

## 2021-03-16 DIAGNOSIS — E669 Obesity, unspecified: Secondary | ICD-10-CM | POA: Diagnosis not present

## 2021-03-16 DIAGNOSIS — E1169 Type 2 diabetes mellitus with other specified complication: Secondary | ICD-10-CM | POA: Diagnosis not present

## 2021-03-16 DIAGNOSIS — R609 Edema, unspecified: Secondary | ICD-10-CM | POA: Diagnosis not present

## 2021-03-16 DIAGNOSIS — E039 Hypothyroidism, unspecified: Secondary | ICD-10-CM | POA: Diagnosis not present

## 2021-03-16 DIAGNOSIS — Z0000 Encounter for general adult medical examination without abnormal findings: Secondary | ICD-10-CM | POA: Diagnosis not present

## 2021-03-16 DIAGNOSIS — I10 Essential (primary) hypertension: Secondary | ICD-10-CM | POA: Diagnosis not present

## 2021-09-10 ENCOUNTER — Other Ambulatory Visit: Payer: Self-pay | Admitting: Internal Medicine

## 2021-09-10 DIAGNOSIS — Z1231 Encounter for screening mammogram for malignant neoplasm of breast: Secondary | ICD-10-CM

## 2021-09-17 DIAGNOSIS — Z1331 Encounter for screening for depression: Secondary | ICD-10-CM | POA: Diagnosis not present

## 2021-09-17 DIAGNOSIS — E669 Obesity, unspecified: Secondary | ICD-10-CM | POA: Diagnosis not present

## 2021-09-17 DIAGNOSIS — R002 Palpitations: Secondary | ICD-10-CM | POA: Diagnosis not present

## 2021-09-17 DIAGNOSIS — Z1339 Encounter for screening examination for other mental health and behavioral disorders: Secondary | ICD-10-CM | POA: Diagnosis not present

## 2021-09-17 DIAGNOSIS — I10 Essential (primary) hypertension: Secondary | ICD-10-CM | POA: Diagnosis not present

## 2021-09-17 DIAGNOSIS — E039 Hypothyroidism, unspecified: Secondary | ICD-10-CM | POA: Diagnosis not present

## 2021-09-17 DIAGNOSIS — Z780 Asymptomatic menopausal state: Secondary | ICD-10-CM | POA: Diagnosis not present

## 2021-09-17 DIAGNOSIS — E1169 Type 2 diabetes mellitus with other specified complication: Secondary | ICD-10-CM | POA: Diagnosis not present

## 2021-09-17 DIAGNOSIS — R21 Rash and other nonspecific skin eruption: Secondary | ICD-10-CM | POA: Diagnosis not present

## 2021-10-07 ENCOUNTER — Encounter (HOSPITAL_COMMUNITY): Payer: Self-pay

## 2021-10-07 ENCOUNTER — Ambulatory Visit (HOSPITAL_COMMUNITY)
Admission: EM | Admit: 2021-10-07 | Discharge: 2021-10-07 | Disposition: A | Discharge status: Home / Self Care | Payer: Medicare PPO

## 2021-10-07 DIAGNOSIS — L509 Urticaria, unspecified: Secondary | ICD-10-CM

## 2021-10-07 MED ORDER — FAMOTIDINE 20 MG PO TABS: 20.0000 mg | ORAL_TABLET | Freq: Every evening | ORAL | 0 refills | Status: AC

## 2021-10-07 MED ORDER — CETIRIZINE HCL 10 MG PO TABS: 10.0000 mg | ORAL_TABLET | Freq: Every day | ORAL | 1 refills | Status: AC

## 2021-10-07 NOTE — Discharge Instructions (Addendum)
Take daily zyrtec with nightly Pepcid every day for the next few days (about a week).  You can add Benadryl at nighttime if itching persists and to help you sleep.  Please go to the emergency department if symptoms worsen or do not improve.

## 2021-10-07 NOTE — ED Provider Notes (Signed)
[facility]    CSN: [id number] Arrival date & time: 10/07/21  1046     History   Chief Complaint Chief Complaint  Patient presents with   Rash    HPI Donna Valenzuela is a 76 y.o. female.  Presents with 4-day history of itching rash to both forearms and inner thighs.  Gradual onset, mostly occurs at nighttime.  She has not tried anything for her symptoms except Aleve. No new soaps, detergents, lotions, medicines, foods.  She is a gardener and usually usually outside. Denies any shortness of breath, trouble breathing or swallowing, swelling of the lips or the tongue.  No abdominal pain or vomiting. Not itching in clinic today.  Past Medical History:  Diagnosis Date   Diabetes mellitus without complication (HCC)    Goiter    Hyperlipidemia    Hypertension     There are no problems to display for this patient.   Past Surgical History:  Procedure Laterality Date   ABDOMINAL HYSTERECTOMY     COLONOSCOPY  02/06/2008   Dr.Perry    OB History   No obstetric history on file.      Home Medications    Prior to Admission medications   Medication Sig Start Date End Date Taking? Authorizing Provider  cetirizine (ZYRTEC ALLERGY) 10 MG tablet Take 1 tablet (10 mg total) by mouth daily. 10/07/21  Yes Durand Wittmeyer, Lurena Joiner, PA-C  famotidine (PEPCID) 20 MG tablet Take 1 tablet (20 mg total) by mouth at bedtime. 10/07/21  Yes Darnella Zeiter, Lurena Joiner, PA-C  amLODipine (NORVASC) 5 MG tablet Take 5 mg by mouth daily.    [provider]  atorvastatin (LIPITOR) 10 MG tablet Take 10 mg by mouth daily.    [provider]  atorvastatin (LIPITOR) 20 MG tablet Take 20 mg by mouth daily. 09/26/21   [provider]  carvedilol (COREG) 25 MG tablet Take 25 mg by mouth 2 (two) times daily with a meal.    [provider]  hydrochlorothiazide (HYDRODIURIL) 25 MG tablet Take 25 mg by mouth daily.    [provider]  levothyroxine (SYNTHROID, LEVOTHROID) 100  MCG tablet Take 100 mcg by mouth daily.    [provider]  metFORMIN (GLUCOPHAGE) 500 MG tablet Take 500 mg by mouth daily with breakfast.    [provider]    Family History Family History  Problem Relation Age of Onset   Heart attack Mother    Hypertension Father    Heart attack Father    Hypertension Sister    Heart attack Brother    Heart attack Brother    Cancer Brother    Hypertension Brother    Colon cancer Neg Hx    Colon polyps Neg Hx    Esophageal cancer Neg Hx    Rectal cancer Neg Hx    Stomach cancer Neg Hx    Breast cancer Neg Hx     Social History Social History   Tobacco Use   Smoking status: Never   Smokeless tobacco: Never  Vaping Use   Vaping Use: Never used  Substance Use Topics   Alcohol use: No   Drug use: No     Allergies   Sulfonamide derivatives   Review of Systems Review of Systems  Skin:  Positive for rash.   Per HPI  Physical Exam Triage Vital Signs ED Triage Vitals [10/07/21 1103]  Enc Vitals Group     BP (!) 150/78     Pulse Rate 78  Resp 16     Temp 98.6 F (37 C)     Temp Source Oral     SpO2 97 %     Weight      Height      Head Circumference      Peak Flow      Pain Score 0     Pain Loc      Pain Edu?      Excl. in GC?    No data found.  Updated Vital Signs BP (!) 150/78 (BP Location: Right Arm)   Pulse 78   Temp 98.6 F (37 C) (Oral)   Resp 16   SpO2 97%    Physical Exam Vitals and nursing note reviewed.  Constitutional:      General: She is not in acute distress.    Appearance: Normal appearance.  HENT:     Mouth/Throat:     Mouth: Mucous membranes are moist.     Pharynx: Oropharynx is clear. No posterior oropharyngeal erythema.  Eyes:     Conjunctiva/sclera: Conjunctivae normal.     Pupils: Pupils are equal, round, and reactive to light.  Cardiovascular:     Rate and Rhythm: Normal rate and regular rhythm.     Pulses: Normal pulses.     Heart sounds: Normal heart  sounds.  Pulmonary:     Effort: Pulmonary effort is normal. No respiratory distress.     Breath sounds: Normal breath sounds. No wheezing.  Abdominal:     Tenderness: There is no abdominal tenderness.  Musculoskeletal:        General: Normal range of motion.     Cervical back: Normal range of motion.  Skin:    Findings: Rash present. Rash is urticarial.     Comments: Urticarial rash/wheals, to bilateral forearms and inner thighs  Neurological:     Mental Status: She is alert and oriented to person, place, and time.    UC Treatments / Results  Labs (all labs ordered are listed, but only abnormal results are displayed) Labs Reviewed - No data to display  EKG  Radiology No results found.  Procedures Procedures   Medications Ordered in UC Medications - No data to display  Initial Impression / Assessment and Plan / UC Course  I have reviewed the triage vital signs and the nursing notes.  Pertinent labs & imaging results that were available during my care of the patient were reviewed by me and considered in my medical decision making (see chart for details).  Urticaria present on forearms and thighs.  She is not currently itching so will send medication to use outpatient.  She can try daily Zyrtec with nightly Pepcid either alone or in combination with a Benadryl.  I recommend she try this regimen for at least a week.  Understands to go to the emergency department if she has any worsening symptoms or trouble breathing. Return precautions discussed. Patient agrees to plan and is discharged in stable condition.  Final Clinical Impressions(s) / UC Diagnoses   Final diagnoses:  Urticaria     Discharge Instructions      Take daily zyrtec with nightly Pepcid every day for the next few days (about a week).  You can add Benadryl at nighttime if itching persists and to help you sleep.  Please go to the emergency department if symptoms worsen or do not improve.     ED  Prescriptions     Medication Sig Dispense Auth. Provider   cetirizine (ZYRTEC ALLERGY)  10 MG tablet Take 1 tablet (10 mg total) by mouth daily. 30 tablet Kiora Hallberg, PA-C   famotidine (PEPCID) 20 MG tablet Take 1 tablet (20 mg total) by mouth at bedtime. 30 tablet Khrystyne Arpin, Lurena Joiner, PA-C      PDMP not reviewed this encounter.   Earon Rivest, Ray Church 10/07/21 1153

## 2021-10-07 NOTE — ED Triage Notes (Signed)
Pt states red itchy rash to arms and legs for the past 4 days.

## 2021-10-21 ENCOUNTER — Ambulatory Visit
Admission: RE | Admit: 2021-10-21 | Discharge: 2021-10-21 | Disposition: A | Discharge status: Home / Self Care | Payer: Medicare PPO | Source: Ambulatory Visit | Attending: Internal Medicine | Admitting: Internal Medicine

## 2021-10-21 DIAGNOSIS — Z1231 Encounter for screening mammogram for malignant neoplasm of breast: Secondary | ICD-10-CM

## 2021-11-12 ENCOUNTER — Ambulatory Visit: Payer: Medicare PPO | Admitting: Cardiology

## 2022-01-19 DIAGNOSIS — H18513 Endothelial corneal dystrophy, bilateral: Secondary | ICD-10-CM | POA: Diagnosis not present

## 2022-01-19 DIAGNOSIS — H43811 Vitreous degeneration, right eye: Secondary | ICD-10-CM | POA: Diagnosis not present

## 2022-01-19 DIAGNOSIS — H2512 Age-related nuclear cataract, left eye: Secondary | ICD-10-CM | POA: Diagnosis not present

## 2022-01-19 DIAGNOSIS — E119 Type 2 diabetes mellitus without complications: Secondary | ICD-10-CM | POA: Diagnosis not present

## 2022-01-19 DIAGNOSIS — Z961 Presence of intraocular lens: Secondary | ICD-10-CM | POA: Diagnosis not present

## 2022-01-29 DIAGNOSIS — Z23 Encounter for immunization: Secondary | ICD-10-CM | POA: Diagnosis not present

## 2022-02-25 ENCOUNTER — Ambulatory Visit: Payer: Medicare PPO | Admitting: Podiatry

## 2022-03-23 DIAGNOSIS — R7989 Other specified abnormal findings of blood chemistry: Secondary | ICD-10-CM | POA: Diagnosis not present

## 2022-03-23 DIAGNOSIS — E1169 Type 2 diabetes mellitus with other specified complication: Secondary | ICD-10-CM | POA: Diagnosis not present

## 2022-03-23 DIAGNOSIS — E039 Hypothyroidism, unspecified: Secondary | ICD-10-CM | POA: Diagnosis not present

## 2022-03-23 DIAGNOSIS — I10 Essential (primary) hypertension: Secondary | ICD-10-CM | POA: Diagnosis not present

## 2022-03-30 DIAGNOSIS — I10 Essential (primary) hypertension: Secondary | ICD-10-CM | POA: Diagnosis not present

## 2022-03-30 DIAGNOSIS — E669 Obesity, unspecified: Secondary | ICD-10-CM | POA: Diagnosis not present

## 2022-03-30 DIAGNOSIS — Z0000 Encounter for general adult medical examination without abnormal findings: Secondary | ICD-10-CM | POA: Diagnosis not present

## 2022-03-30 DIAGNOSIS — R002 Palpitations: Secondary | ICD-10-CM | POA: Diagnosis not present

## 2022-03-30 DIAGNOSIS — Z1331 Encounter for screening for depression: Secondary | ICD-10-CM | POA: Diagnosis not present

## 2022-03-30 DIAGNOSIS — R82998 Other abnormal findings in urine: Secondary | ICD-10-CM | POA: Diagnosis not present

## 2022-03-30 DIAGNOSIS — R609 Edema, unspecified: Secondary | ICD-10-CM | POA: Diagnosis not present

## 2022-03-30 DIAGNOSIS — R21 Rash and other nonspecific skin eruption: Secondary | ICD-10-CM | POA: Diagnosis not present

## 2022-03-30 DIAGNOSIS — E039 Hypothyroidism, unspecified: Secondary | ICD-10-CM | POA: Diagnosis not present

## 2022-03-30 DIAGNOSIS — E1169 Type 2 diabetes mellitus with other specified complication: Secondary | ICD-10-CM | POA: Diagnosis not present

## 2022-04-15 DIAGNOSIS — E039 Hypothyroidism, unspecified: Secondary | ICD-10-CM | POA: Diagnosis not present

## 2022-04-15 DIAGNOSIS — E669 Obesity, unspecified: Secondary | ICD-10-CM | POA: Diagnosis not present

## 2022-04-15 DIAGNOSIS — I10 Essential (primary) hypertension: Secondary | ICD-10-CM | POA: Diagnosis not present

## 2022-04-15 DIAGNOSIS — R21 Rash and other nonspecific skin eruption: Secondary | ICD-10-CM | POA: Diagnosis not present

## 2022-04-15 DIAGNOSIS — R002 Palpitations: Secondary | ICD-10-CM | POA: Diagnosis not present

## 2022-04-15 DIAGNOSIS — R609 Edema, unspecified: Secondary | ICD-10-CM | POA: Diagnosis not present

## 2022-04-15 DIAGNOSIS — E1169 Type 2 diabetes mellitus with other specified complication: Secondary | ICD-10-CM | POA: Diagnosis not present

## 2022-09-14 ENCOUNTER — Other Ambulatory Visit: Payer: Self-pay | Admitting: Internal Medicine

## 2022-09-14 DIAGNOSIS — Z0000 Encounter for general adult medical examination without abnormal findings: Secondary | ICD-10-CM

## 2022-09-30 DIAGNOSIS — E039 Hypothyroidism, unspecified: Secondary | ICD-10-CM | POA: Diagnosis not present

## 2022-09-30 DIAGNOSIS — I10 Essential (primary) hypertension: Secondary | ICD-10-CM | POA: Diagnosis not present

## 2022-09-30 DIAGNOSIS — E669 Obesity, unspecified: Secondary | ICD-10-CM | POA: Diagnosis not present

## 2022-09-30 DIAGNOSIS — E1169 Type 2 diabetes mellitus with other specified complication: Secondary | ICD-10-CM | POA: Diagnosis not present

## 2022-09-30 DIAGNOSIS — E876 Hypokalemia: Secondary | ICD-10-CM | POA: Diagnosis not present

## 2022-09-30 DIAGNOSIS — R609 Edema, unspecified: Secondary | ICD-10-CM | POA: Diagnosis not present

## 2022-09-30 DIAGNOSIS — R002 Palpitations: Secondary | ICD-10-CM | POA: Diagnosis not present

## 2022-10-24 ENCOUNTER — Ambulatory Visit: Admission: RE | Admit: 2022-10-24 | Payer: Medicare PPO | Source: Ambulatory Visit

## 2022-10-24 DIAGNOSIS — Z1231 Encounter for screening mammogram for malignant neoplasm of breast: Secondary | ICD-10-CM | POA: Diagnosis not present

## 2022-10-24 DIAGNOSIS — Z0000 Encounter for general adult medical examination without abnormal findings: Secondary | ICD-10-CM

## 2023-02-02 DIAGNOSIS — L089 Local infection of the skin and subcutaneous tissue, unspecified: Secondary | ICD-10-CM | POA: Diagnosis not present

## 2023-02-02 DIAGNOSIS — L723 Sebaceous cyst: Secondary | ICD-10-CM | POA: Diagnosis not present

## 2023-03-28 DIAGNOSIS — E1169 Type 2 diabetes mellitus with other specified complication: Secondary | ICD-10-CM | POA: Diagnosis not present

## 2023-03-28 DIAGNOSIS — E039 Hypothyroidism, unspecified: Secondary | ICD-10-CM | POA: Diagnosis not present

## 2023-03-28 DIAGNOSIS — I10 Essential (primary) hypertension: Secondary | ICD-10-CM | POA: Diagnosis not present

## 2023-04-04 DIAGNOSIS — Z0000 Encounter for general adult medical examination without abnormal findings: Secondary | ICD-10-CM | POA: Diagnosis not present

## 2023-04-04 DIAGNOSIS — R002 Palpitations: Secondary | ICD-10-CM | POA: Diagnosis not present

## 2023-04-04 DIAGNOSIS — R82998 Other abnormal findings in urine: Secondary | ICD-10-CM | POA: Diagnosis not present

## 2023-04-04 DIAGNOSIS — E669 Obesity, unspecified: Secondary | ICD-10-CM | POA: Diagnosis not present

## 2023-04-04 DIAGNOSIS — R609 Edema, unspecified: Secondary | ICD-10-CM | POA: Diagnosis not present

## 2023-04-04 DIAGNOSIS — I10 Essential (primary) hypertension: Secondary | ICD-10-CM | POA: Diagnosis not present

## 2023-04-04 DIAGNOSIS — R21 Rash and other nonspecific skin eruption: Secondary | ICD-10-CM | POA: Diagnosis not present

## 2023-04-04 DIAGNOSIS — E876 Hypokalemia: Secondary | ICD-10-CM | POA: Diagnosis not present

## 2023-04-04 DIAGNOSIS — E1169 Type 2 diabetes mellitus with other specified complication: Secondary | ICD-10-CM | POA: Diagnosis not present

## 2023-04-04 DIAGNOSIS — E039 Hypothyroidism, unspecified: Secondary | ICD-10-CM | POA: Diagnosis not present

## 2023-04-07 DIAGNOSIS — H04123 Dry eye syndrome of bilateral lacrimal glands: Secondary | ICD-10-CM | POA: Diagnosis not present

## 2023-04-07 DIAGNOSIS — H2512 Age-related nuclear cataract, left eye: Secondary | ICD-10-CM | POA: Diagnosis not present

## 2023-04-07 DIAGNOSIS — Z961 Presence of intraocular lens: Secondary | ICD-10-CM | POA: Diagnosis not present

## 2023-04-07 DIAGNOSIS — E119 Type 2 diabetes mellitus without complications: Secondary | ICD-10-CM | POA: Diagnosis not present

## 2023-04-07 DIAGNOSIS — H43811 Vitreous degeneration, right eye: Secondary | ICD-10-CM | POA: Diagnosis not present

## 2023-04-07 DIAGNOSIS — H18513 Endothelial corneal dystrophy, bilateral: Secondary | ICD-10-CM | POA: Diagnosis not present

## 2023-06-14 ENCOUNTER — Ambulatory Visit (INDEPENDENT_AMBULATORY_CARE_PROVIDER_SITE_OTHER): Payer: Medicare PPO | Admitting: Primary Care

## 2023-06-28 IMAGING — MG MM DIGITAL SCREENING BILAT W/ TOMO AND CAD
8 series · 8 of 24 positions shown · non-contrast
Comparison: Previous exam(s).

CLINICAL DATA: Screening.

EXAM:
DIGITAL SCREENING BILATERAL MAMMOGRAM WITH TOMOSYNTHESIS AND CAD
TECHNIQUE: Bilateral screening digital craniocaudal and mediolateral oblique
mammograms were obtained. Bilateral screening digital breast
tomosynthesis was performed. The images were evaluated with
computer-aided detection.

[L MLO synth-2D]
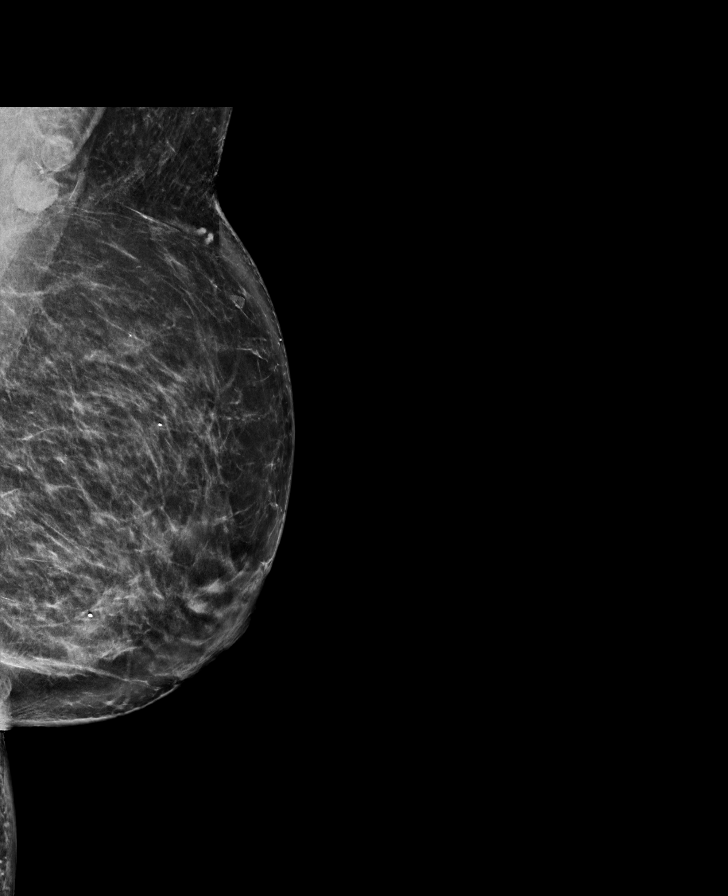

[R MLO synth-2D]
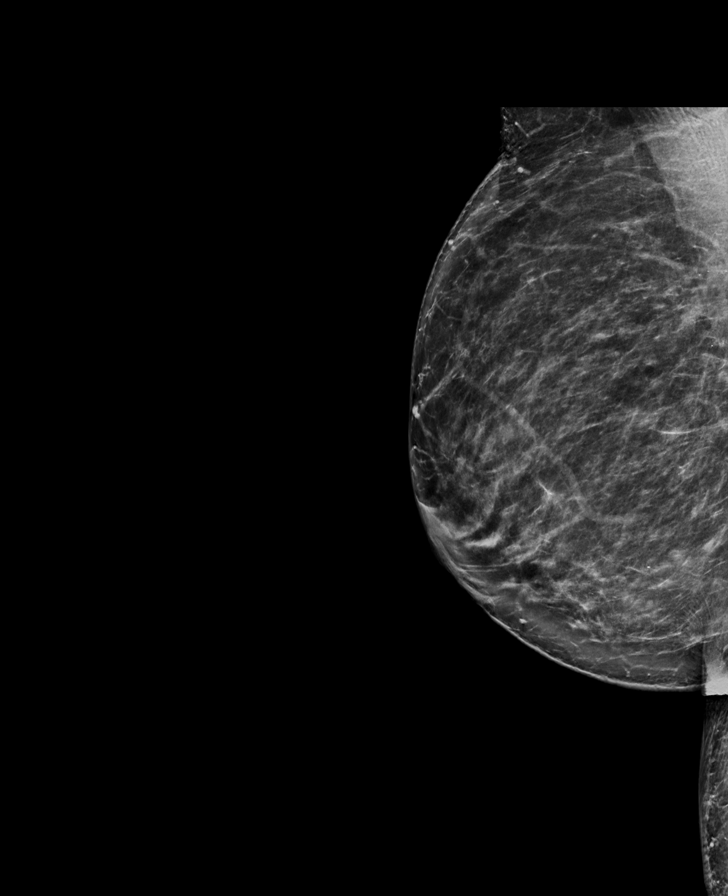

[R CC synth-2D]
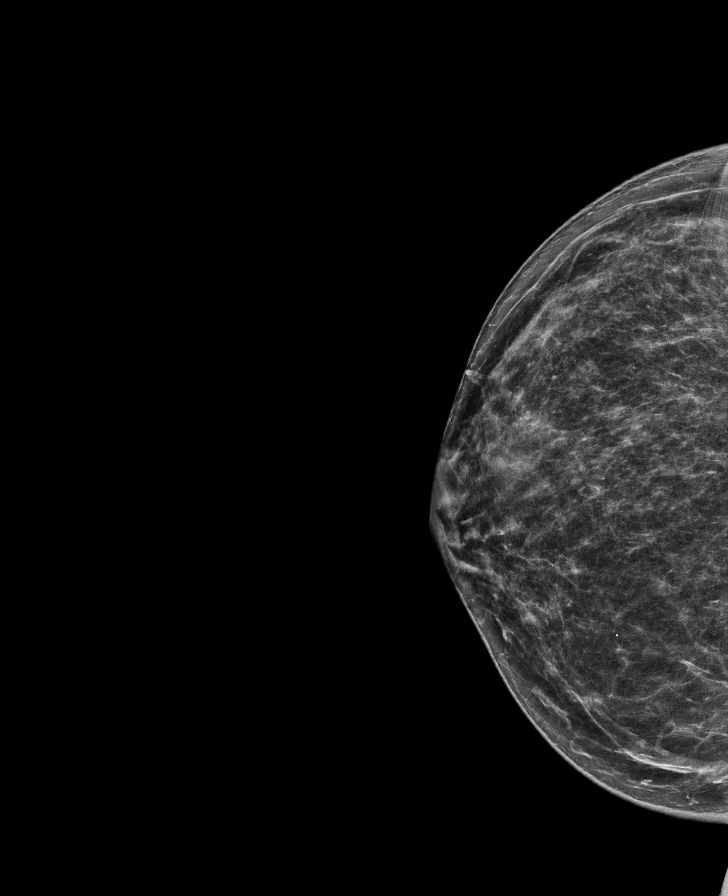

[L CC synth-2D]
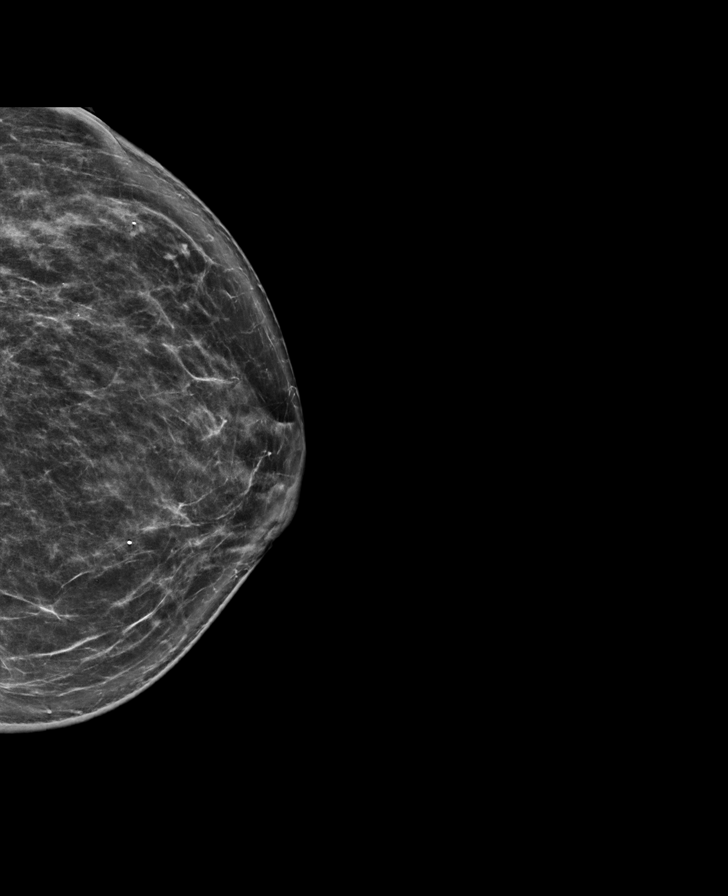

[R CC tomo · tomo slice 35/70.0]
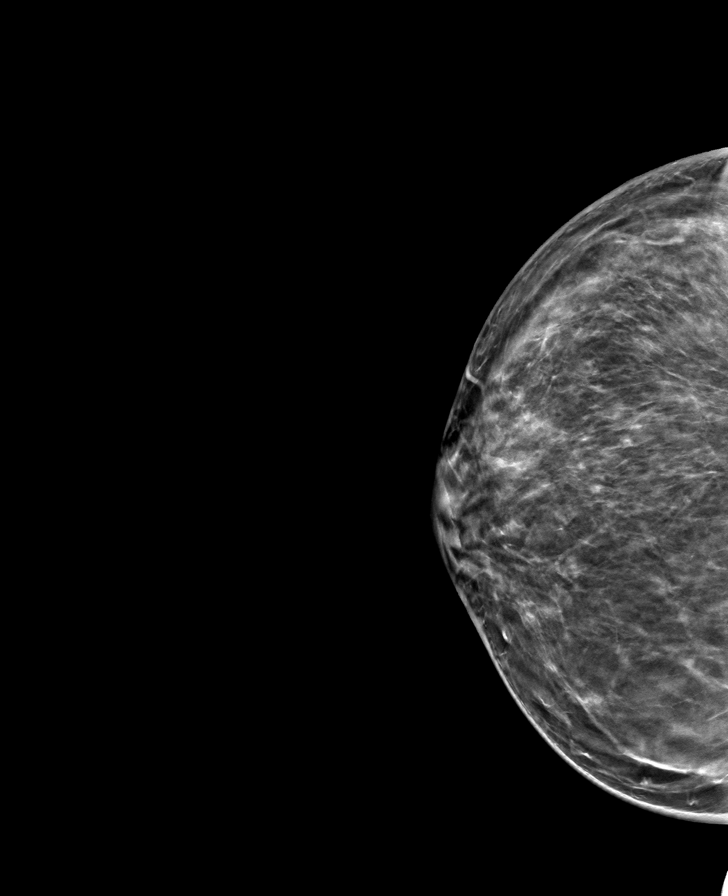

[L MLO tomo · tomo slice 45/88.0]
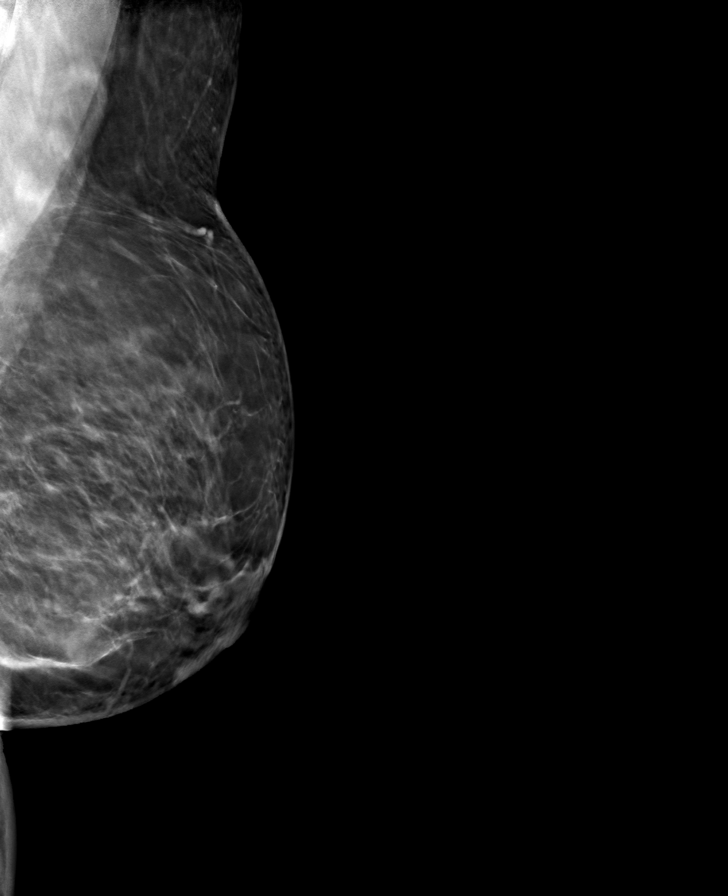

[R MLO tomo · tomo slice 45/90.0]
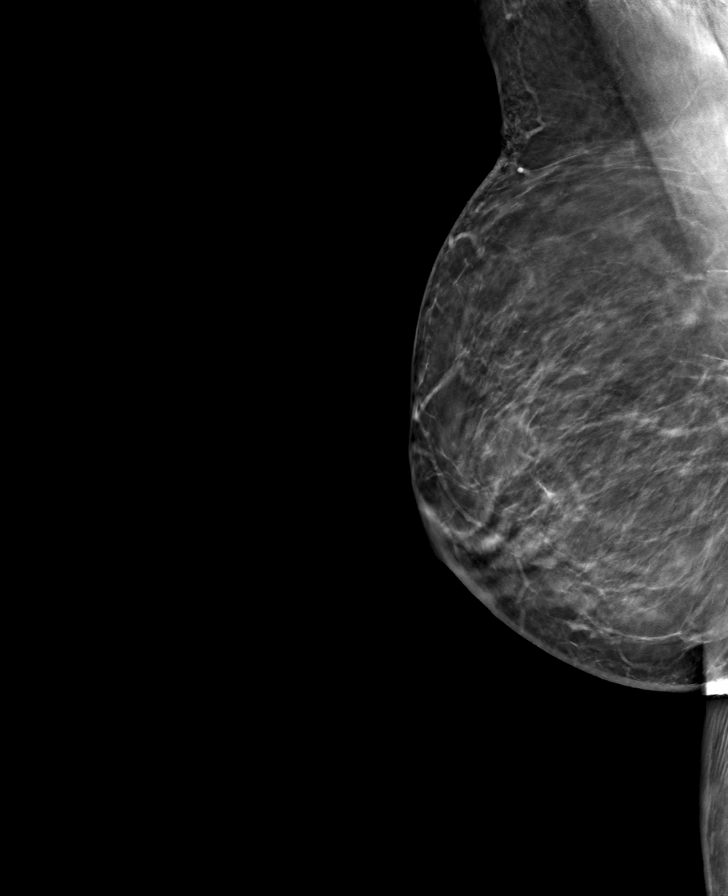

[L CC tomo · tomo slice 42/83.0]
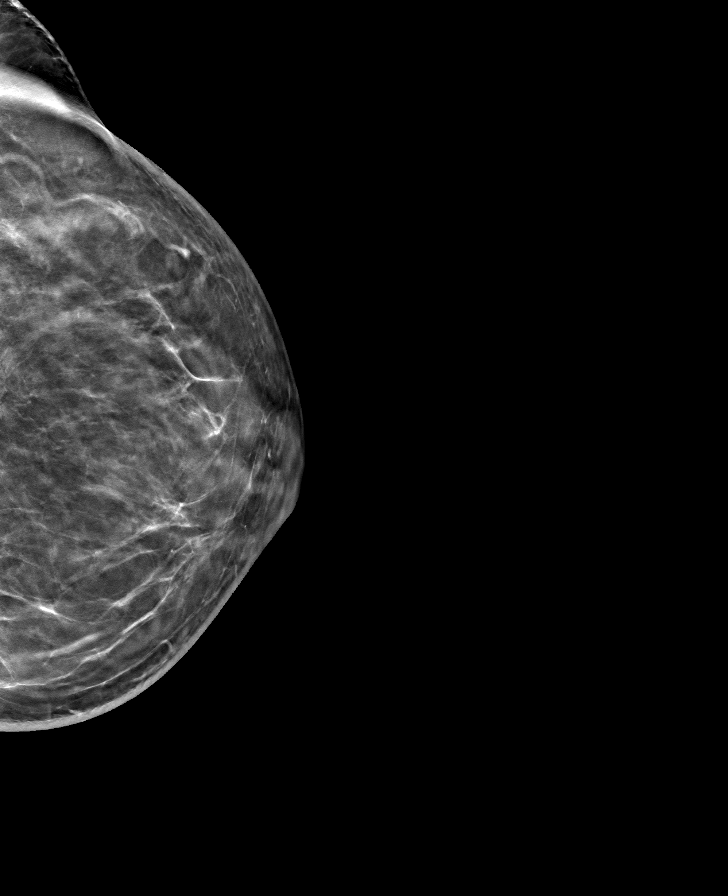

[8 of 24 positions shown; findings below may reference images not displayed]

ACR Breast Density Category b: There are scattered areas of
fibroglandular density.
FINDINGS: There are no findings suspicious for malignancy.
IMPRESSION: No mammographic evidence of malignancy. A result letter of this
screening mammogram will be mailed directly to the patient.

RECOMMENDATION:
Screening mammogram in one year. (Code:51-O-LD2)

BI-RADS CATEGORY  1: Negative.

## 2023-09-18 ENCOUNTER — Other Ambulatory Visit: Payer: Self-pay | Admitting: Internal Medicine

## 2023-09-18 DIAGNOSIS — Z1231 Encounter for screening mammogram for malignant neoplasm of breast: Secondary | ICD-10-CM

## 2023-10-03 DIAGNOSIS — E039 Hypothyroidism, unspecified: Secondary | ICD-10-CM | POA: Diagnosis not present

## 2023-10-03 DIAGNOSIS — E1169 Type 2 diabetes mellitus with other specified complication: Secondary | ICD-10-CM | POA: Diagnosis not present

## 2023-10-03 DIAGNOSIS — R609 Edema, unspecified: Secondary | ICD-10-CM | POA: Diagnosis not present

## 2023-10-03 DIAGNOSIS — I10 Essential (primary) hypertension: Secondary | ICD-10-CM | POA: Diagnosis not present

## 2023-10-03 DIAGNOSIS — E876 Hypokalemia: Secondary | ICD-10-CM | POA: Diagnosis not present

## 2023-10-03 DIAGNOSIS — M25512 Pain in left shoulder: Secondary | ICD-10-CM | POA: Diagnosis not present

## 2023-10-03 DIAGNOSIS — R002 Palpitations: Secondary | ICD-10-CM | POA: Diagnosis not present

## 2023-10-03 DIAGNOSIS — E669 Obesity, unspecified: Secondary | ICD-10-CM | POA: Diagnosis not present

## 2023-10-20 ENCOUNTER — Emergency Department (HOSPITAL_COMMUNITY)

## 2023-10-20 ENCOUNTER — Other Ambulatory Visit: Payer: Self-pay

## 2023-10-20 ENCOUNTER — Emergency Department (HOSPITAL_COMMUNITY)
Admission: EM | Admit: 2023-10-20 | Discharge: 2023-10-20 | Disposition: A | Discharge status: Home / Self Care | Attending: Emergency Medicine | Admitting: Emergency Medicine

## 2023-10-20 DIAGNOSIS — S46002A Unspecified injury of muscle(s) and tendon(s) of the rotator cuff of left shoulder, initial encounter: Secondary | ICD-10-CM | POA: Insufficient documentation

## 2023-10-20 DIAGNOSIS — M7989 Other specified soft tissue disorders: Secondary | ICD-10-CM | POA: Diagnosis not present

## 2023-10-20 DIAGNOSIS — X500XXA Overexertion from strenuous movement or load, initial encounter: Secondary | ICD-10-CM | POA: Diagnosis not present

## 2023-10-20 DIAGNOSIS — Z79890 Hormone replacement therapy (postmenopausal): Secondary | ICD-10-CM | POA: Insufficient documentation

## 2023-10-20 DIAGNOSIS — Z79899 Other long term (current) drug therapy: Secondary | ICD-10-CM | POA: Insufficient documentation

## 2023-10-20 DIAGNOSIS — Z7984 Long term (current) use of oral hypoglycemic drugs: Secondary | ICD-10-CM | POA: Diagnosis not present

## 2023-10-20 DIAGNOSIS — E119 Type 2 diabetes mellitus without complications: Secondary | ICD-10-CM | POA: Insufficient documentation

## 2023-10-20 DIAGNOSIS — E039 Hypothyroidism, unspecified: Secondary | ICD-10-CM | POA: Insufficient documentation

## 2023-10-20 DIAGNOSIS — Y93H2 Activity, gardening and landscaping: Secondary | ICD-10-CM | POA: Insufficient documentation

## 2023-10-20 DIAGNOSIS — M25512 Pain in left shoulder: Secondary | ICD-10-CM | POA: Diagnosis not present

## 2023-10-20 DIAGNOSIS — I10 Essential (primary) hypertension: Secondary | ICD-10-CM | POA: Diagnosis not present

## 2023-10-20 DIAGNOSIS — M79602 Pain in left arm: Secondary | ICD-10-CM | POA: Diagnosis present

## 2023-10-20 DIAGNOSIS — M19012 Primary osteoarthritis, left shoulder: Secondary | ICD-10-CM | POA: Diagnosis not present

## 2023-10-20 LAB — BASIC METABOLIC PANEL WITH GFR
Anion gap: 11 (ref 5–15)
BUN: 14 mg/dL (ref 8–23)
CO2: 28 mmol/L (ref 22–32)
Calcium: 8.8 mg/dL — ABNORMAL LOW (ref 8.9–10.3)
Chloride: 101 mmol/L (ref 98–111)
Creatinine, Ser: 0.93 mg/dL (ref 0.44–1.00)
GFR, Estimated: >60 mL/min (ref >60)
Glucose, Bld: 109 mg/dL — ABNORMAL HIGH (ref 70–99)
Potassium: 3.5 mmol/L (ref 3.5–5.1)
Sodium: 140 mmol/L (ref 135–145)

## 2023-10-20 LAB — D-DIMER, QUANTITATIVE: D-Dimer, Quant: 1.78 ug{FEU}/mL — ABNORMAL HIGH (ref 0.00–0.50)

## 2023-10-20 LAB — CBC WITH DIFFERENTIAL/PLATELET
Abs Immature Granulocytes: 0.02 K/uL (ref 0.00–0.07)
Basophils Absolute: 0 K/uL (ref 0.0–0.1)
Basophils Relative: 0 %
Eosinophils Absolute: 0.2 K/uL (ref 0.0–0.5)
Eosinophils Relative: 3 %
HCT: 41.2 % (ref 36.0–46.0)
Hemoglobin: 13.7 g/dL (ref 12.0–15.0)
Immature Granulocytes: 0 %
Lymphocytes Relative: 18 %
Lymphs Abs: 1.1 K/uL (ref 0.7–4.0)
MCH: 30.6 pg (ref 26.0–34.0)
MCHC: 33.3 g/dL (ref 30.0–36.0)
MCV: 92 fL (ref 80.0–100.0)
Monocytes Absolute: 0.5 K/uL (ref 0.1–1.0)
Monocytes Relative: 8 %
Neutro Abs: 4.3 K/uL (ref 1.7–7.7)
Neutrophils Relative %: 71 %
Platelets: 184 K/uL (ref 150–400)
RBC: 4.48 MIL/uL (ref 3.87–5.11)
RDW: 12.5 % (ref 11.5–15.5)
WBC: 6.1 K/uL (ref 4.0–10.5)
nRBC: 0 % (ref 0.0–0.2)

## 2023-10-20 MED ORDER — METFORMIN HCL 500 MG PO TABS
500.0000 mg | ORAL_TABLET | Freq: Once | ORAL | Status: AC
  Administered 2023-10-20: 500 mg via ORAL
  Filled 2023-10-20: qty 1

## 2023-10-20 MED ORDER — ATORVASTATIN CALCIUM 10 MG PO TABS
10.0000 mg | ORAL_TABLET | Freq: Once | ORAL | Status: AC
  Administered 2023-10-20: 10 mg via ORAL
  Filled 2023-10-20: qty 1

## 2023-10-20 MED ORDER — AMLODIPINE BESYLATE 5 MG PO TABS
5.0000 mg | ORAL_TABLET | Freq: Once | ORAL | Status: AC
  Administered 2023-10-20: 5 mg via ORAL
  Filled 2023-10-20: qty 1

## 2023-10-20 MED ORDER — HYDROCODONE-ACETAMINOPHEN 5-325 MG PO TABS: 1.0000 | ORAL_TABLET | ORAL | 0 refills | Status: AC | PRN

## 2023-10-20 MED ORDER — HYDROCHLOROTHIAZIDE 25 MG PO TABS
25.0000 mg | ORAL_TABLET | Freq: Once | ORAL | Status: AC
  Administered 2023-10-20: 25 mg via ORAL
  Filled 2023-10-20: qty 1

## 2023-10-20 MED ORDER — CARVEDILOL 12.5 MG PO TABS
25.0000 mg | ORAL_TABLET | Freq: Once | ORAL | Status: AC
  Administered 2023-10-20: 25 mg via ORAL
  Filled 2023-10-20: qty 2

## 2023-10-20 MED ORDER — LEVOTHYROXINE SODIUM 100 MCG PO TABS
100.0000 ug | ORAL_TABLET | Freq: Once | ORAL | Status: AC
  Administered 2023-10-20: 100 ug via ORAL
  Filled 2023-10-20: qty 1

## 2023-10-20 MED ORDER — DEXAMETHASONE SODIUM PHOSPHATE 10 MG/ML IJ SOLN
10.0000 mg | Freq: Once | INTRAMUSCULAR | Status: AC
  Administered 2023-10-20: 10 mg via INTRAVENOUS
  Filled 2023-10-20: qty 1

## 2023-10-20 MED ORDER — PREDNISONE 50 MG PO TABS: 50.0000 mg | ORAL_TABLET | Freq: Every day | ORAL | 0 refills | Status: AC

## 2023-10-20 MED ORDER — KETOROLAC TROMETHAMINE 30 MG/ML IJ SOLN
30.0000 mg | Freq: Once | INTRAMUSCULAR | Status: AC
  Administered 2023-10-20: 30 mg via INTRAVENOUS
  Filled 2023-10-20: qty 1

## 2023-10-20 NOTE — Progress Notes (Signed)
 LUE venous duplex has been completed.  Preliminary results given to Dr. Dean.   Results can be found under chart review under CV PROC. 10/20/2023 2:56 PM Ruby Dilone RVT, RDMS

## 2023-10-20 NOTE — ED Triage Notes (Signed)
 Patient reports she has been doing a lot of gardening and her L arm has been hurting intermittently x 1 week. Has been to PCP for same who advised her to go to [facility], but she states she has tried to call them without success in making an appointment. Pain worse with movement. Denies pain at present.

## 2023-10-20 NOTE — ED Provider Notes (Signed)
 [place] EMERGENCY DEPARTMENT AT [facility] Provider Note   CSN: [id number] Arrival date & time: 10/20/23  9157     Patient presents with: Arm Pain   Donna Valenzuela is a 78 y.o. female.   Pt is a 78 yo with pmhx significant for htn, hld, hypothyroidism, and dm2.  Pt said she's been doing a lot of gardening which involves reaching with her left arm.  Pt said she's had worsening pain to her left shoulder for about a week.  Pt said she did see her pcp who referred her to ortho, but she's having trouble getting ahold of ortho, so has been unable to make an appt.  Pcp did not give her any meds.  Pt said pain is now severe.  She is unable to lift her left arm up without help from her right due to pain.  Once arm has been lifted, she can move it.         Prior to Admission medications   Medication Sig Start Date End Date Taking? Authorizing Provider  HYDROcodone -acetaminophen  (NORCO/VICODIN) 5-325 MG tablet Take 1 tablet by mouth every 4 (four) hours as needed. 10/20/23  Yes Dean Clarity, MD  predniSONE  (DELTASONE ) 50 MG tablet Take 1 tablet (50 mg total) by mouth daily with breakfast. 10/20/23  Yes Dean Clarity, MD  amLODipine  (NORVASC ) 5 MG tablet Take 5 mg by mouth daily.    [provider]  atorvastatin  (LIPITOR) 10 MG tablet Take 10 mg by mouth daily.    [provider]  atorvastatin  (LIPITOR) 20 MG tablet Take 20 mg by mouth daily. 09/26/21   [provider]  carvedilol  (COREG ) 25 MG tablet Take 25 mg by mouth 2 (two) times daily with a meal.    [provider]  cetirizine  (ZYRTEC  ALLERGY) 10 MG tablet Take 1 tablet (10 mg total) by mouth daily. 10/07/21   Rising, Asberry, PA-C  famotidine  (PEPCID ) 20 MG tablet Take 1 tablet (20 mg total) by mouth at bedtime. 10/07/21   Rising, Asberry, PA-C  hydrochlorothiazide  (HYDRODIURIL ) 25 MG tablet Take 25 mg by mouth daily.    [provider]  levothyroxine  (SYNTHROID , LEVOTHROID) 100  MCG tablet Take 100 mcg by mouth daily.    [provider]  metFORMIN  (GLUCOPHAGE ) 500 MG tablet Take 500 mg by mouth daily with breakfast.    [provider]    Allergies: Sulfonamide derivatives    Review of Systems  Musculoskeletal:        Left shoulder pain  All other systems reviewed and are negative.   Updated Vital Signs BP (!) 156/91 (BP Location: Right Wrist)   Pulse 87   Temp 98.1 F (36.7 C)   Resp 14   SpO2 98%   Physical Exam Vitals and nursing note reviewed.  Constitutional:      Appearance: Normal appearance. She is obese.  HENT:     Head: Normocephalic and atraumatic.     Right Ear: External ear normal.     Left Ear: External ear normal.     Nose: Nose normal.     Mouth/Throat:     Mouth: Mucous membranes are moist.     Pharynx: Oropharynx is clear.  Eyes:     Extraocular Movements: Extraocular movements intact.     Conjunctiva/sclera: Conjunctivae normal.     Pupils: Pupils are equal, round, and reactive to light.  Cardiovascular:     Rate and Rhythm: Normal rate and regular rhythm.  Pulses: Normal pulses.     Heart sounds: Normal heart sounds.  Pulmonary:     Effort: Pulmonary effort is normal.     Breath sounds: Normal breath sounds.  Abdominal:     General: Abdomen is flat. Bowel sounds are normal.     Palpations: Abdomen is soft.  Musculoskeletal:     Left shoulder: Tenderness present. Decreased range of motion.     Cervical back: Normal range of motion and neck supple.  Skin:    General: Skin is warm.     Capillary Refill: Capillary refill takes less than 2 seconds.  Neurological:     General: No focal deficit present.     Mental Status: She is alert and oriented to person, place, and time.  Psychiatric:        Mood and Affect: Mood normal.        Behavior: Behavior normal.     (all labs ordered are listed, but only abnormal results are displayed) Labs Reviewed  BASIC METABOLIC PANEL WITH GFR - Abnormal; Notable  for the following components:      Result Value   Glucose, Bld 109 (*)    Calcium  8.8 (*)    All other components within normal limits  D-DIMER, QUANTITATIVE - Abnormal; Notable for the following components:   D-Dimer, Quant 1.78 (*)    All other components within normal limits  CBC WITH DIFFERENTIAL/PLATELET    EKG: EKG Interpretation Date/Time:  Friday October 20 2023 13:39:18 EDT Ventricular Rate:  82 PR Interval:  154 QRS Duration:  86 QT Interval:  364 QTC Calculation: 425 R Axis:   -47  Text Interpretation: Normal sinus rhythm Left anterior fascicular block Moderate voltage criteria for LVH, may be normal variant ( R in aVL , Cornell product ) Possible Anterior infarct , age undetermined Abnormal ECG When compared with ECG of 03-Mar-2008 09:46, PREVIOUS ECG IS PRESENT No significant change since last tracing Confirmed by Dean Clarity [phone]) on 10/20/2023 1:42:27 PM  Radiology: [facility] Shoulder Left Result Date: 10/20/2023 CLINICAL DATA:  78 year old female with shoulder pain. EXAM: LEFT SHOULDER - 2+ VIEW COMPARISON:  Chest radiographs 02/12/2008. FINDINGS: Three views 0925 hours. No glenohumeral joint dislocation. Bone mineralization is within normal limits for age. Proximal left humerus intact. Left clavicle and scapula appear intact. Moderate to severe degenerative spurring at both the left acromioclavicular joint and undersurface of the left acromion. No dislocation identified. No acute osseous abnormality identified. Chronic tortuosity of the visible thoracic aorta. No acute fracture of the visible left ribs. IMPRESSION: 1. No acute osseous abnormality identified. 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 2. Chronic tortuosity of the thoracic aorta. Electronically Signed   By: VEAR Hurst M.D.   On: 10/20/2023 09:35     Procedures   Medications Ordered in the ED  ketorolac  (TORADOL ) 30 MG/ML injection 30 mg (30 mg Intravenous Given  10/20/23 1020)  dexamethasone  (DECADRON ) injection 10 mg (10 mg Intravenous Given 10/20/23 1020)  carvedilol  (COREG ) tablet 25 mg (25 mg Oral Given 10/20/23 1212)  amLODipine  (NORVASC ) tablet 5 mg (5 mg Oral Given 10/20/23 1212)  hydrochlorothiazide  (HYDRODIURIL ) tablet 25 mg (25 mg Oral Given 10/20/23 1212)  metFORMIN  (GLUCOPHAGE ) tablet 500 mg (500 mg Oral Given 10/20/23 1212)  levothyroxine  (SYNTHROID ) tablet 100 mcg (100 mcg Oral Given 10/20/23 1212)  atorvastatin  (LIPITOR) tablet 10 mg (10 mg Oral Given 10/20/23 1216)  Medical Decision Making Amount and/or Complexity of Data Reviewed Labs: ordered. Radiology: ordered.  Risk Prescription drug management.   This patient presents to the ED for concern of left arm pain and weakness, this involves an extensive number of treatment options, and is a complaint that carries with it a high risk of complications and morbidity.  The differential diagnosis includes rotator cuff strain, cervical radicular pain, cardiac   Co morbidities that complicate the patient evaluation  htn, hld, hypothyroidism, and dm2   Additional history obtained:  Additional history obtained from epic chart review  Lab Tests:  I Ordered, and personally interpreted labs.  The pertinent results include:  cbc nl, bmp nl, ddimer +   Imaging Studies ordered:  I ordered imaging studies including l shoulder  I independently visualized and interpreted imaging which showed  XR:   No acute osseous abnormality identified. Moderate to severe  degenerative spurring at the left acromioclavicular joint and  acromion.  2. Chronic tortuosity of the thoracic aorta.  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  Left arm sl more swollen, but likely due to the fact that it's been hanging down and not moving as much.  Us  neg for DVT.  Pt is stable for d/c.  Return if worse.  F/u with pcp.   Reevaluation:  After the interventions noted above, I reevaluated the patient and found that they have :improved   Social Determinants of Health:  Lives at home   Dispostion:  After consideration of the diagnostic results and the patients response to treatment, I feel that the patent would benefit from discharge with outpatient f/u.       Final diagnoses:  Injury of left rotator cuff, initial encounter    ED Discharge Orders          Ordered    predniSONE  (DELTASONE ) 50 MG tablet  Daily with breakfast        10/20/23 1356    HYDROcodone -acetaminophen  (NORCO/VICODIN) 5-325 MG tablet  Every 4 hours PRN        10/20/23 1356               Isaiyah Feldhaus, MD 10/20/23 1357

## 2023-10-20 NOTE — ED Notes (Signed)
 Pt ambulated to bathroom

## 2023-10-26 ENCOUNTER — Ambulatory Visit
Admission: RE | Admit: 2023-10-26 | Discharge: 2023-10-26 | Disposition: A | Discharge status: Home / Self Care | Source: Ambulatory Visit | Attending: Internal Medicine | Admitting: Internal Medicine

## 2023-10-26 DIAGNOSIS — Z1231 Encounter for screening mammogram for malignant neoplasm of breast: Secondary | ICD-10-CM

## 2023-10-30 DIAGNOSIS — M25512 Pain in left shoulder: Secondary | ICD-10-CM | POA: Diagnosis not present

## 2024-01-23 DIAGNOSIS — I10 Essential (primary) hypertension: Secondary | ICD-10-CM | POA: Diagnosis not present

## 2024-01-23 DIAGNOSIS — N390 Urinary tract infection, site not specified: Secondary | ICD-10-CM | POA: Diagnosis not present

## 2024-01-23 DIAGNOSIS — R42 Dizziness and giddiness: Secondary | ICD-10-CM | POA: Diagnosis not present

## 2024-01-23 DIAGNOSIS — E1169 Type 2 diabetes mellitus with other specified complication: Secondary | ICD-10-CM | POA: Diagnosis not present

## 2024-01-23 DIAGNOSIS — R3915 Urgency of urination: Secondary | ICD-10-CM | POA: Diagnosis not present

## 2024-01-31 DIAGNOSIS — M25512 Pain in left shoulder: Secondary | ICD-10-CM | POA: Diagnosis not present

## 2024-02-29 DIAGNOSIS — Z1321 Encounter for screening for nutritional disorder: Secondary | ICD-10-CM | POA: Diagnosis not present

## 2024-02-29 DIAGNOSIS — G250 Essential tremor: Secondary | ICD-10-CM | POA: Diagnosis not present

## 2024-02-29 DIAGNOSIS — Z79899 Other long term (current) drug therapy: Secondary | ICD-10-CM | POA: Diagnosis not present

## 2024-02-29 DIAGNOSIS — Z1159 Encounter for screening for other viral diseases: Secondary | ICD-10-CM | POA: Diagnosis not present

## 2024-02-29 DIAGNOSIS — I10 Essential (primary) hypertension: Secondary | ICD-10-CM | POA: Diagnosis not present

## 2024-02-29 DIAGNOSIS — E039 Hypothyroidism, unspecified: Secondary | ICD-10-CM | POA: Diagnosis not present

## 2024-02-29 DIAGNOSIS — M19012 Primary osteoarthritis, left shoulder: Secondary | ICD-10-CM | POA: Diagnosis not present

## 2024-02-29 DIAGNOSIS — E785 Hyperlipidemia, unspecified: Secondary | ICD-10-CM | POA: Diagnosis not present

## 2024-02-29 DIAGNOSIS — E118 Type 2 diabetes mellitus with unspecified complications: Secondary | ICD-10-CM | POA: Diagnosis not present
